# Patient Record
Sex: Male | Born: 1985 | Race: White | Hispanic: No | Marital: Married | Smoking: Never smoker
Health system: Southern US, Academic
[De-identification: ages and names within clinical notes are randomized; demographics above are authoritative.]

## PROBLEM LIST (undated history)

## (undated) DIAGNOSIS — I471 Supraventricular tachycardia, unspecified: Secondary | ICD-10-CM

## (undated) DIAGNOSIS — F411 Generalized anxiety disorder: Secondary | ICD-10-CM

## (undated) DIAGNOSIS — M1990 Unspecified osteoarthritis, unspecified site: Secondary | ICD-10-CM

## (undated) DIAGNOSIS — K900 Celiac disease: Secondary | ICD-10-CM

## (undated) DIAGNOSIS — G4720 Circadian rhythm sleep disorder, unspecified type: Secondary | ICD-10-CM

## (undated) DIAGNOSIS — I10 Essential (primary) hypertension: Secondary | ICD-10-CM

## (undated) DIAGNOSIS — G40909 Epilepsy, unspecified, not intractable, without status epilepticus: Secondary | ICD-10-CM

## (undated) HISTORY — DX: Generalized anxiety disorder: F41.1

## (undated) HISTORY — PX: HX HERNIA REPAIR: SHX51

## (undated) HISTORY — PX: HX HIP REPLACEMENT: SHX124

## (undated) HISTORY — PX: COLONOSCOPY: SHX174

## (undated) HISTORY — PX: HX VASECTOMY: SHX75

## (undated) HISTORY — PX: ORTHOPEDIC SURGERY: SHX850

## (undated) HISTORY — PX: HX SHOULDER SURGERY: 2100001311

## (undated) HISTORY — PX: HX UPPER ENDOSCOPY: 2100001144

## (undated) HISTORY — PX: RENAL ARTERY STENT: SHX2321

## (undated) HISTORY — PX: HX GALL BLADDER SURGERY/CHOLE: SHX55

## (undated) HISTORY — PX: HX APPENDECTOMY: SHX54

## (undated) HISTORY — DX: Circadian rhythm sleep disorder, unspecified type: G47.20

## (undated) HISTORY — DX: Supraventricular tachycardia (CMS HCC): I47.1

## (undated) HISTORY — PX: BLADDER SURGERY: SHX569

## (undated) HISTORY — DX: Epilepsy, unspecified, not intractable, without status epilepticus: G40.909

## (undated) HISTORY — PX: SEPTOPLASTY: SUR1290

## (undated) HISTORY — PX: HX LITHOTRIPSY: SHX66

## (undated) HISTORY — DX: Unspecified osteoarthritis, unspecified site: M19.90

## (undated) HISTORY — PX: HIP SURGERY: SHX245

## (undated) HISTORY — DX: Supraventricular tachycardia, unspecified: I47.10

---
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
## 2016-08-02 IMAGING — CR XRAY SHOULDER COMPLETE LT
1 series · 2 of 2 positions shown · non-contrast
Comparison: None

Exam:   

Left shoulder 2V
INDICATION: Pain.

[Series 1: view not recorded · 0.17mm/px · 2 of 2 slices shown]
[im 1/2]
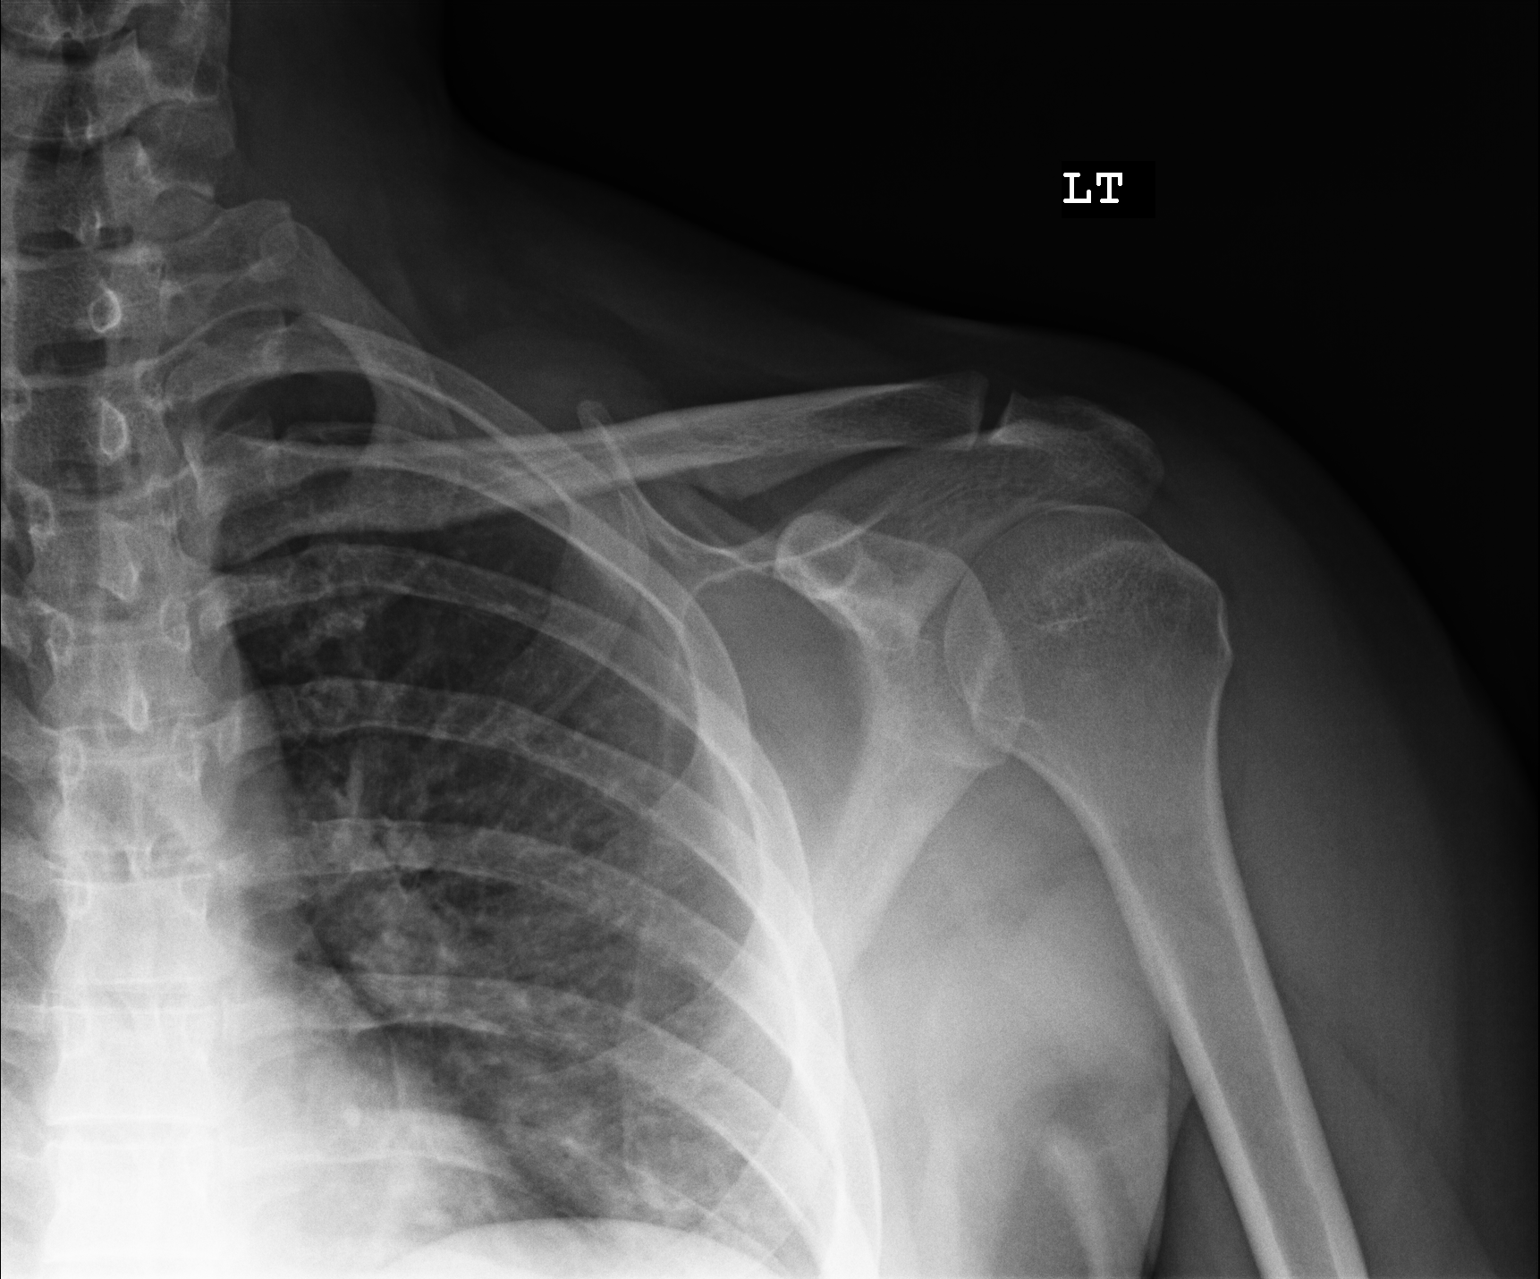
[im 2/2]
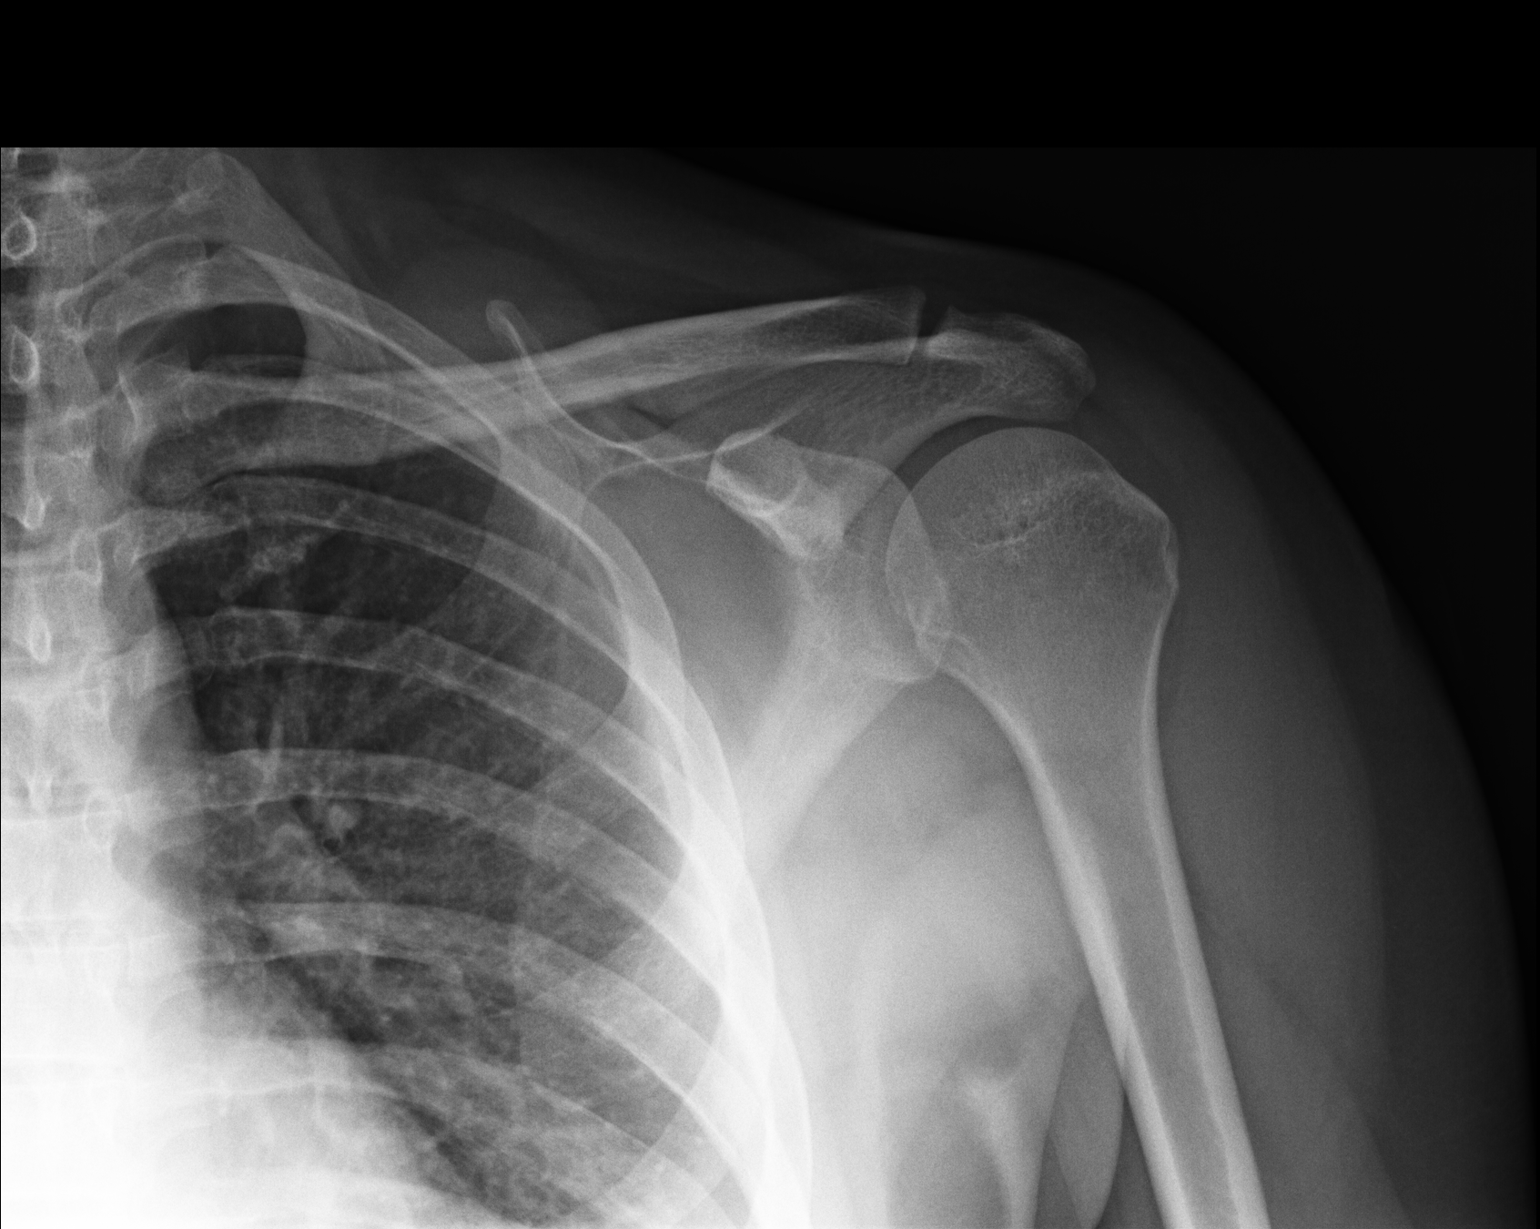

[2 of 2 positions shown; findings below may reference images not displayed]

FINDINGS: The humerus is seen in internal and external rotation. No fracture of the humerus or glenoid is seen. No evidence for dislocation is seen. The acromial humeral space is normal. The acromioclavicular joint is unremarkable. 

The visualized lung is clear.
IMPRESSION: Negative left shoulder.

## 2017-07-31 IMAGING — CR ABDOMEN XRAY 1 VIEW
1 series · 1 of 1 positions shown · non-contrast
Comparison: None.

Exam:  ABDOMEN XRAY 1 VIEW
INDICATION: Hematuria and kidney stones.

[view not recorded]
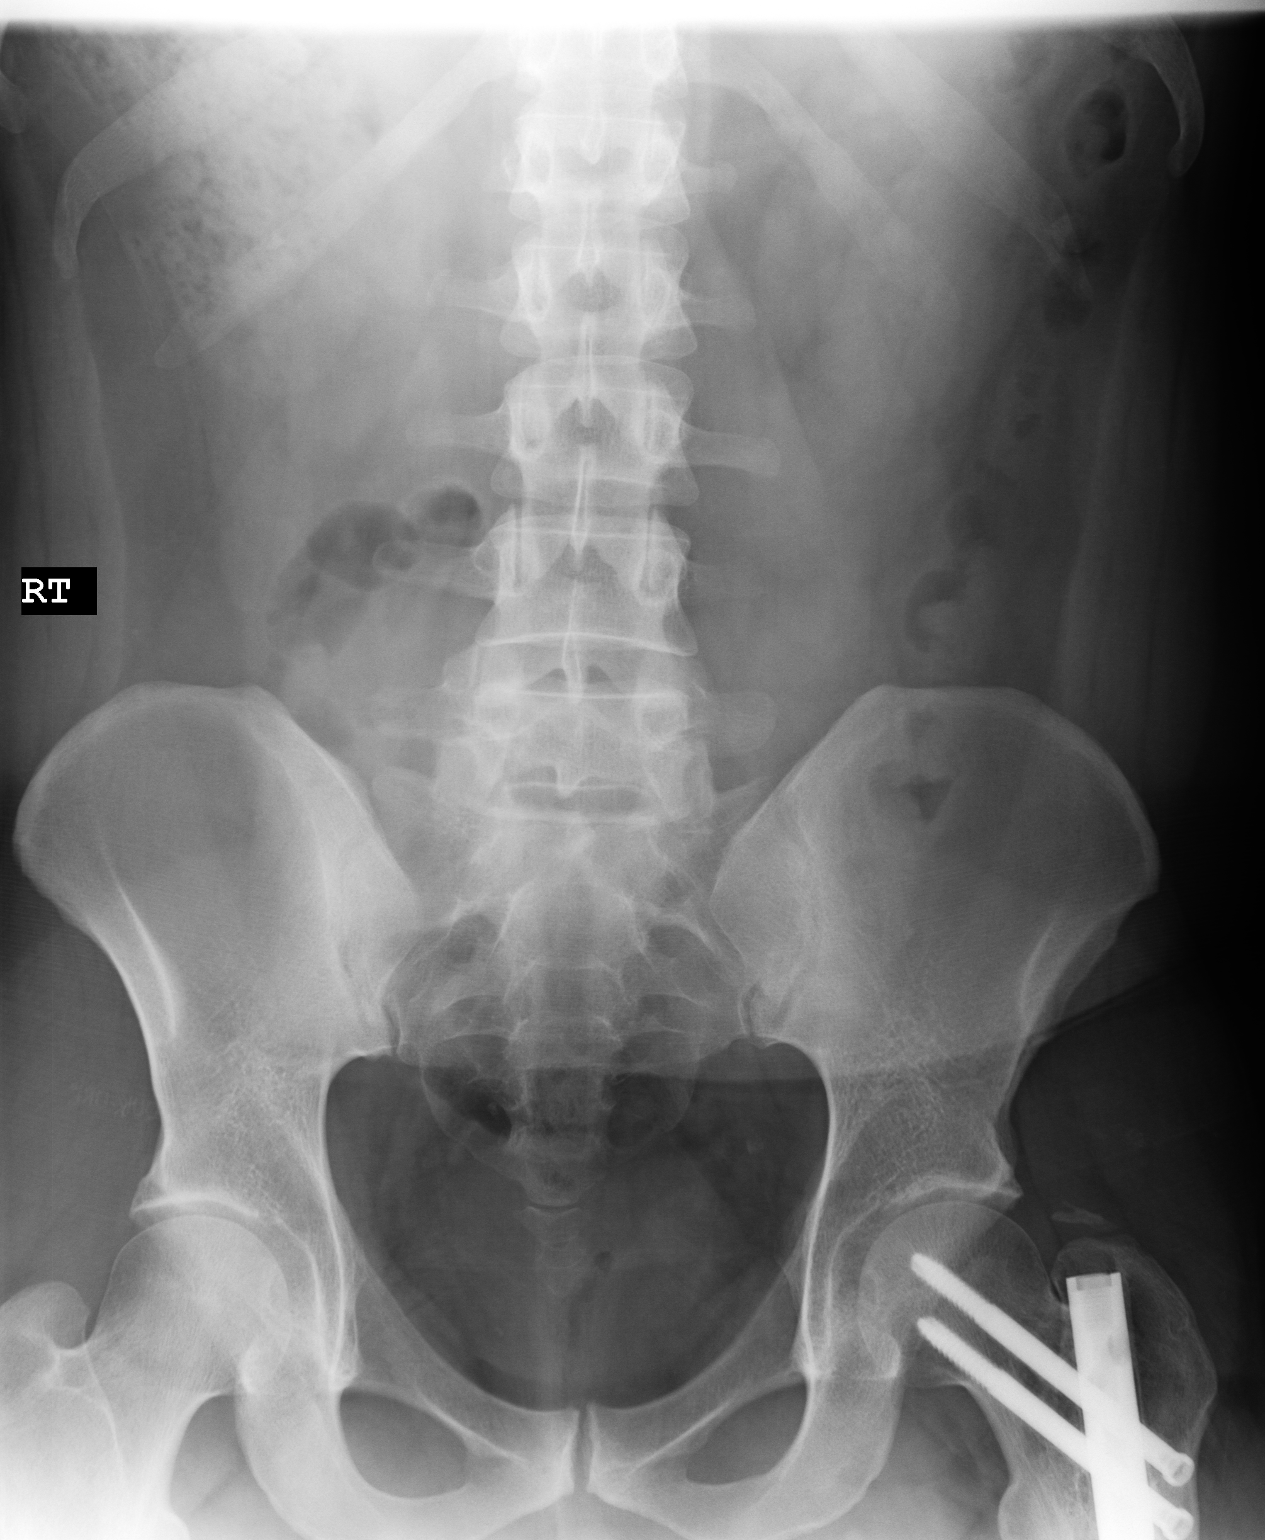

[1 of 1 positions shown; findings below may reference images not displayed]

FINDINGS: Bowel gas pattern is normal. No radiopaque renal calculi are seen. Post-surgical changes are noted within the left hip. Surrounding soft tissues are unremarkable.
IMPRESSION: Unremarkable exam. Please consider further evaluation with CT scan for persistent or worsening symptoms.

## 2018-10-02 IMAGING — CR XRAY LUMBAR SPINE COMPLETE
1 series · 5 of 5 positions shown · non-contrast
Comparison: None available.

EXAM:  XRAY LUMBAR SPINE COMPLETE
INDICATION: Low back pain.

[Series 1: view not recorded · 0.17mm/px · 5 of 5 slices shown]
[im 1/5]
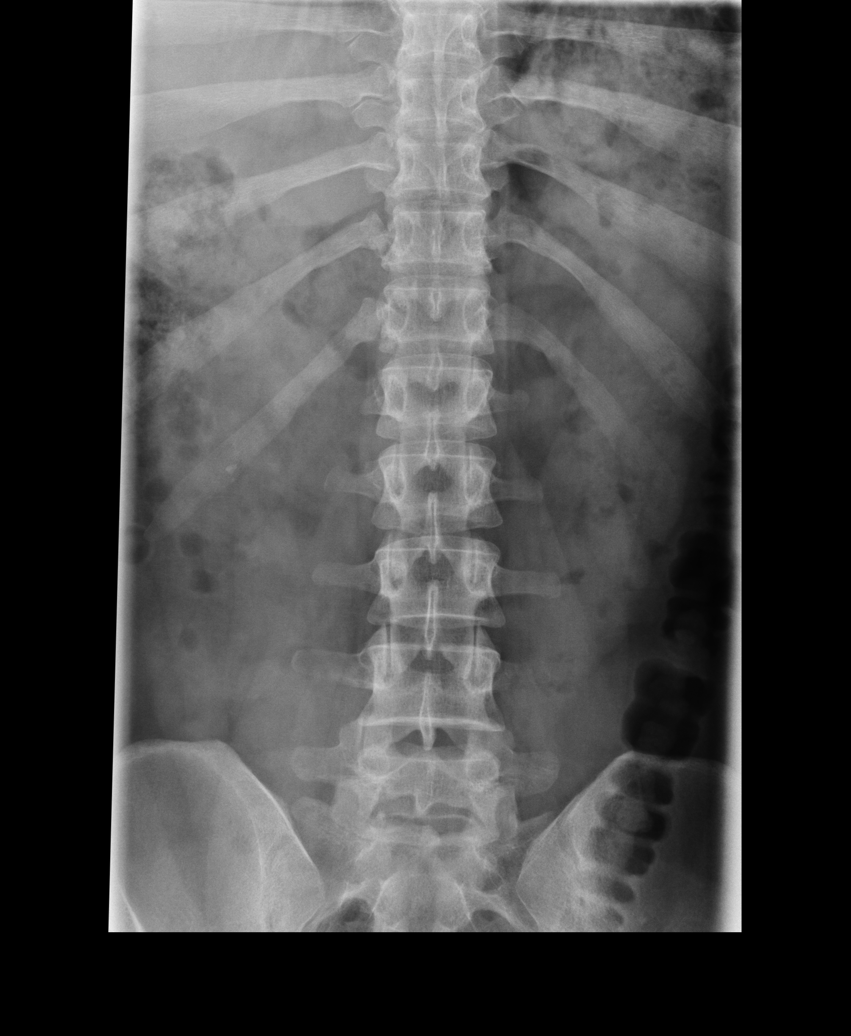
[im 2/5]
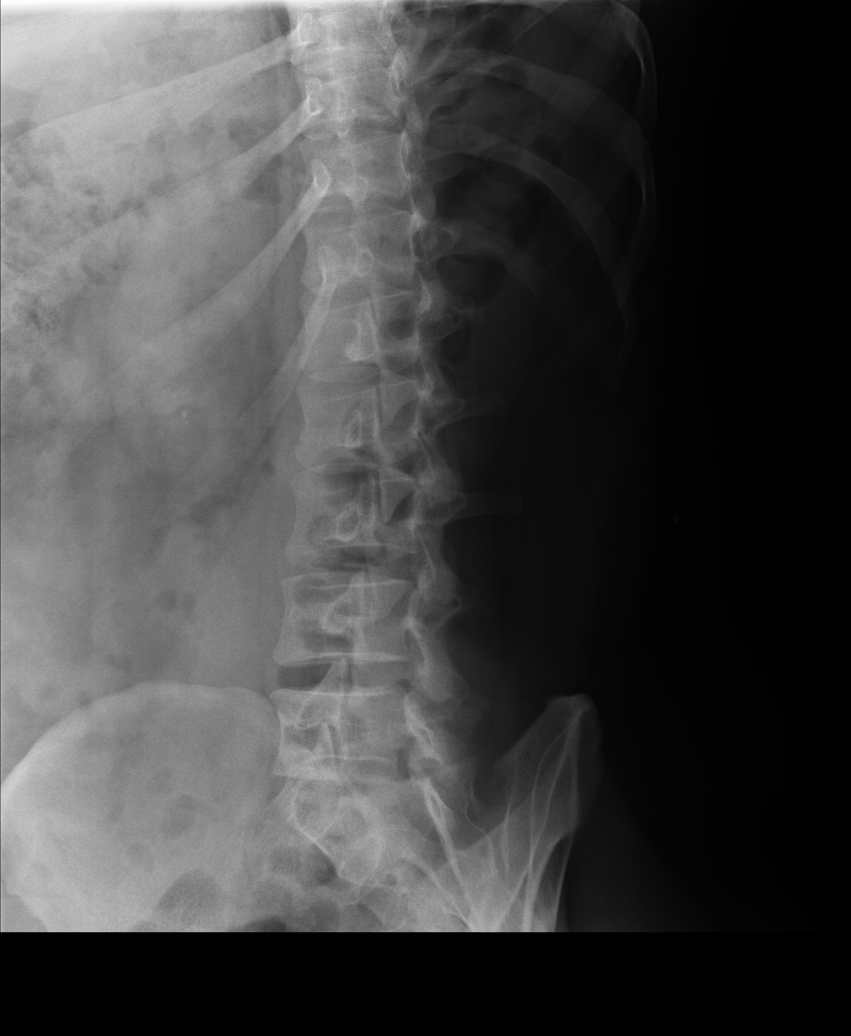
[im 3/5]
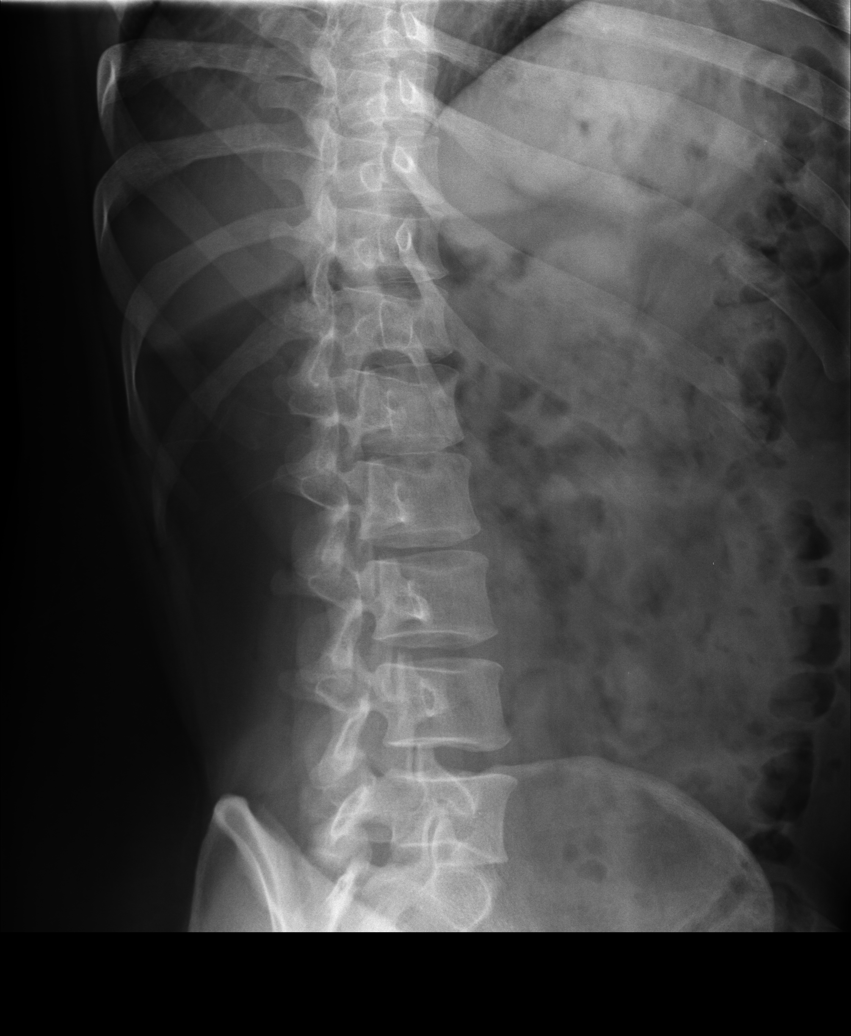
[im 4/5]
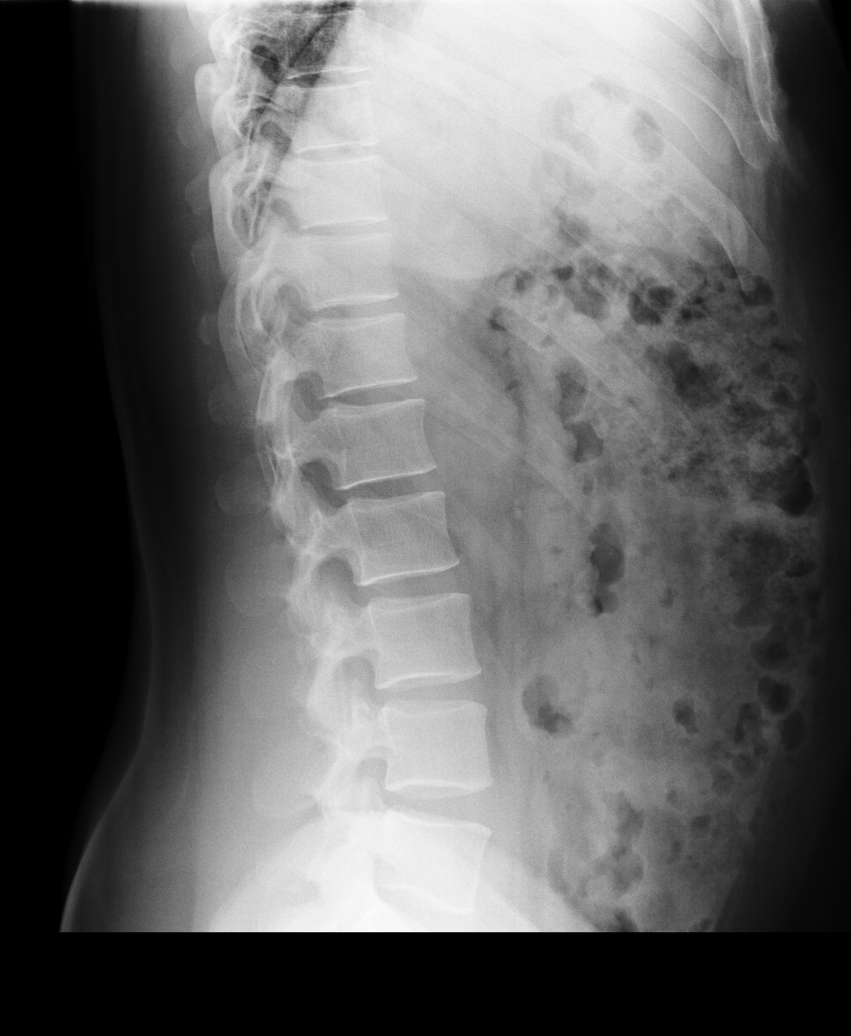
[im 5/5]
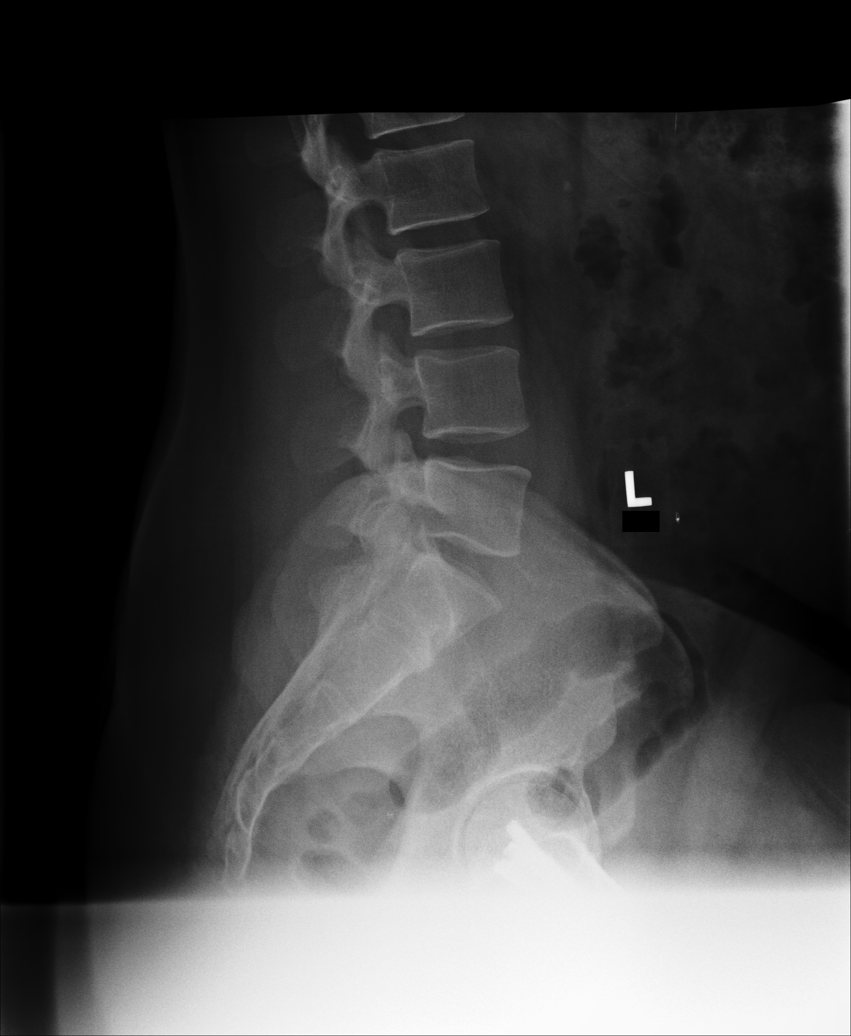

[5 of 5 positions shown; findings below may reference images not displayed]

FINDINGS: Vertebral bodies are normal in height and alignment. There is no acute fracture or subluxation. No significant degenerative disc disease or facet arthropathy is seen at any level. There is no definite pars defect on the oblique views. Paraspinal soft tissues are unremarkable.
IMPRESSION: Unremarkable exam.

## 2018-10-02 IMAGING — CR ABDOMEN XRAY 2 VIEWS
1 series · 2 of 2 positions shown · non-contrast
Comparison: 07/31/2017.

EXAM:  ABDOMEN XRAY 2 VIEWS
INDICATION: Abdominal and back pain.

[Series 1: view not recorded · 0.17mm/px · 2 of 2 slices shown]
[im 1/2]
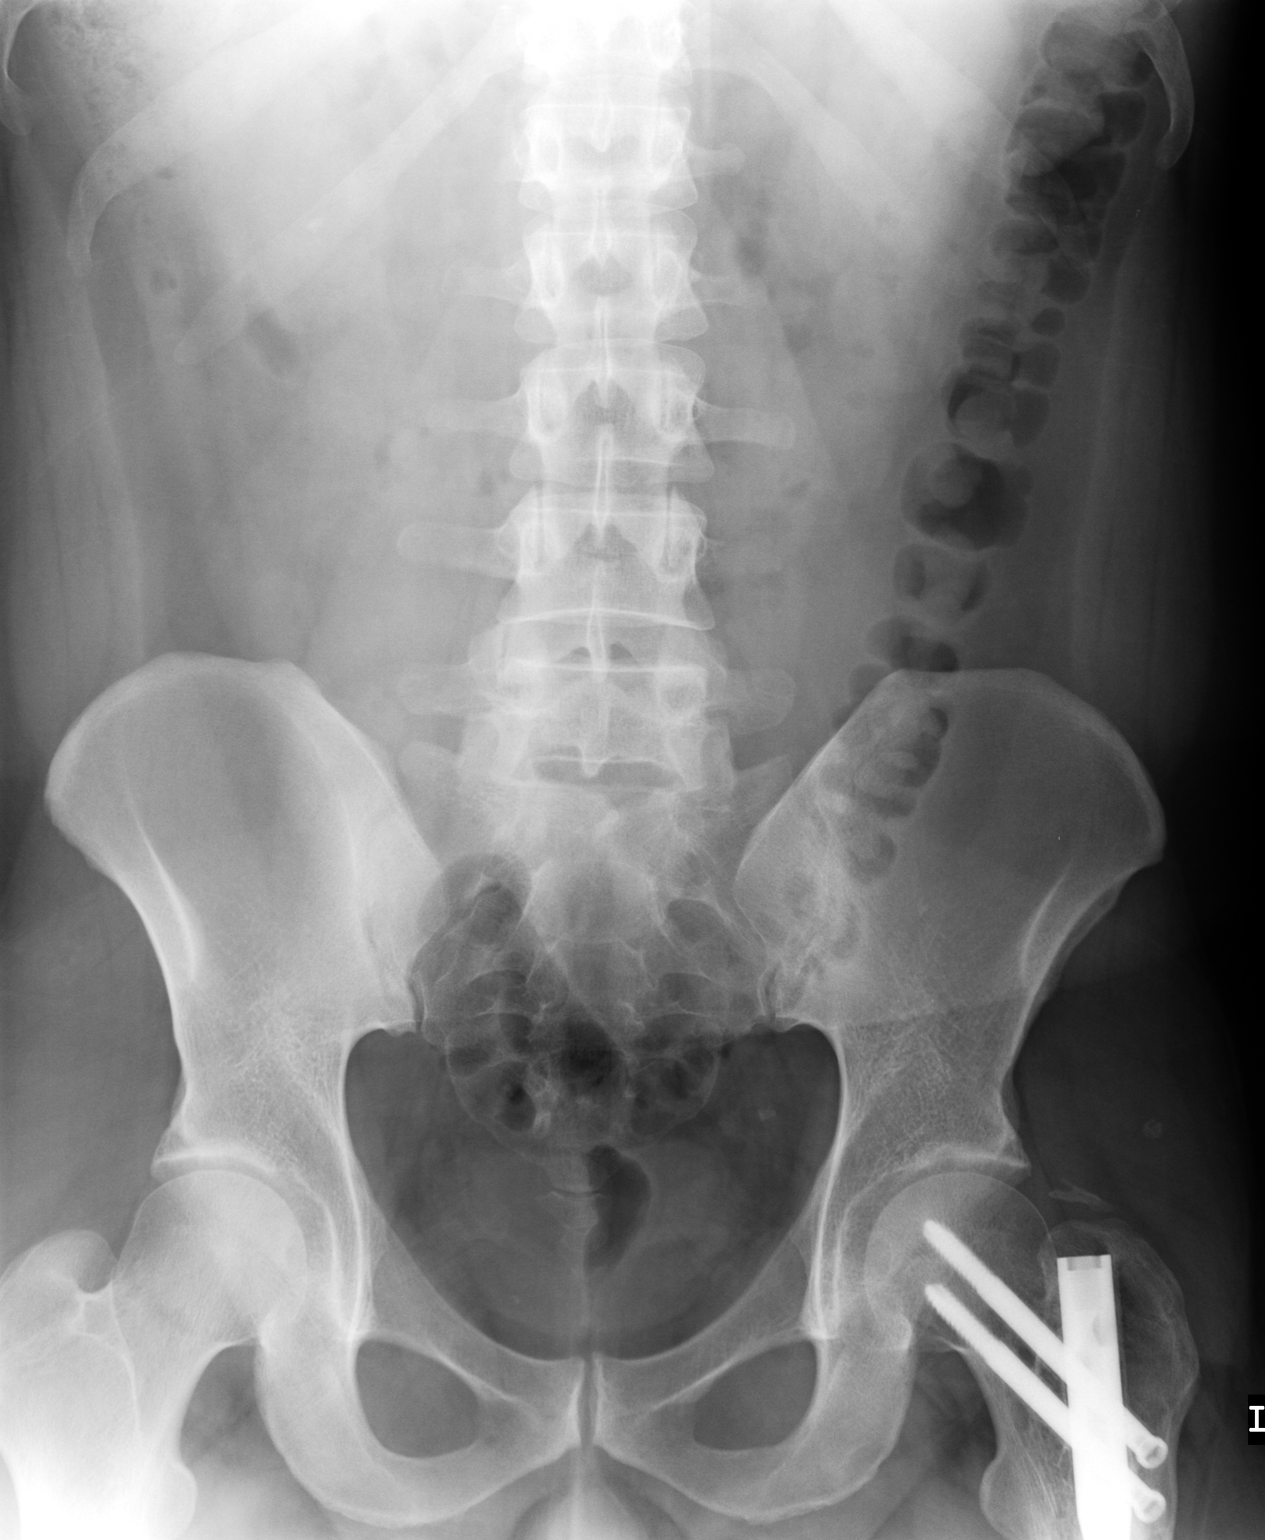
[im 2/2]
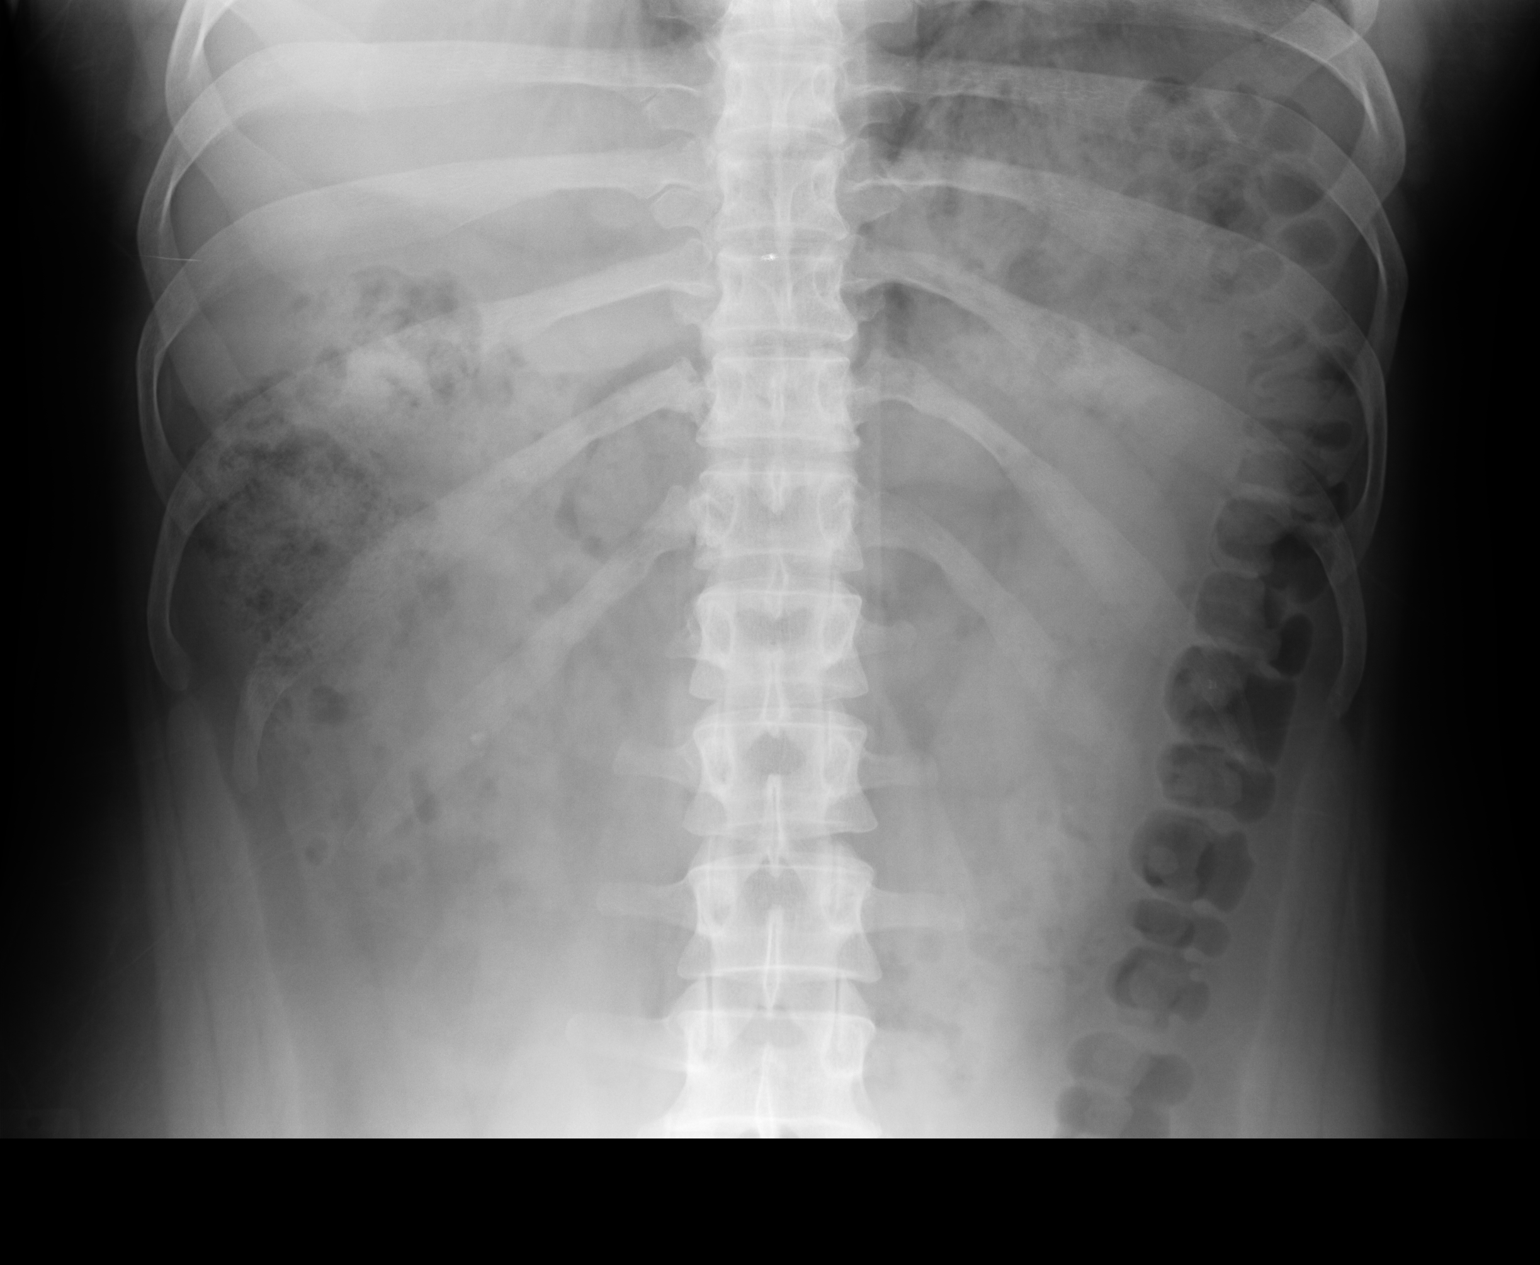

[2 of 2 positions shown; findings below may reference images not displayed]

FINDINGS: There is a questionable 4 mm right renal calculus. Moderate amount of stool is seen throughout the colon. Postsurgical changes of the left hip are again noted.
IMPRESSION: Questionable 4 mm right renal calculus.

## 2020-04-08 IMAGING — MR MRI JOINT UPPER EXTREMITY WITHOUT CONTRAST LT
5 of 6 series · 33 of 40 positions shown · non-contrast
Comparison: None.

EXAM:  MRI JOINT UPPER EXTREMITY WITHOUT CONTRAST LT SHOULDER
INDICATION: Left shoulder pain.
TECHNIQUE: An MRI of the patient’s left shoulder is performed with multiple images in the axial, oblique coronal and oblique sagittal planes.  T1 and T2-weighted scanning procotols are utilized.

[Series 9: T1 · oblique · left · 4.0mm · 0.31mm/px · 8 of 22 slices shown]
[im 1/22]
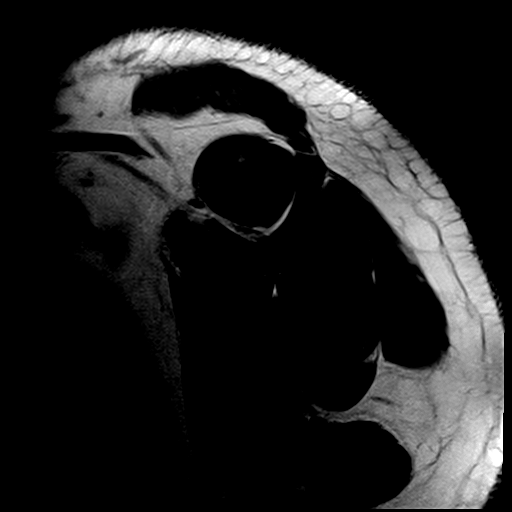
[im 4/22]
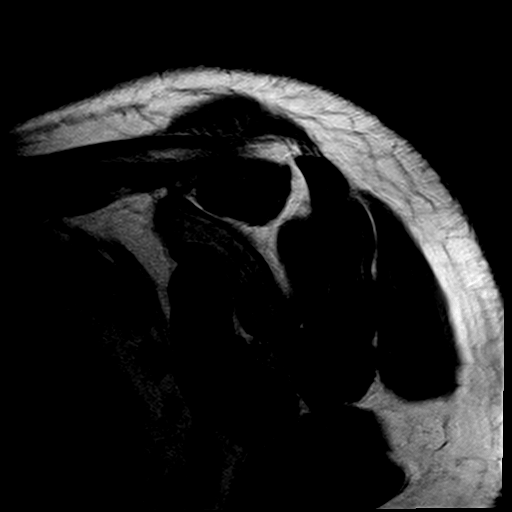
[im 7/22]
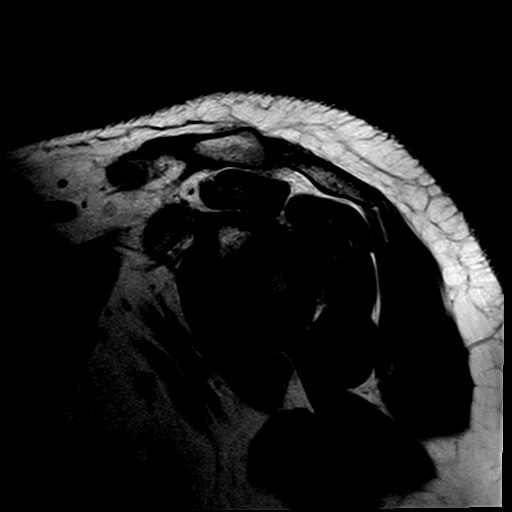
[im 10/22]
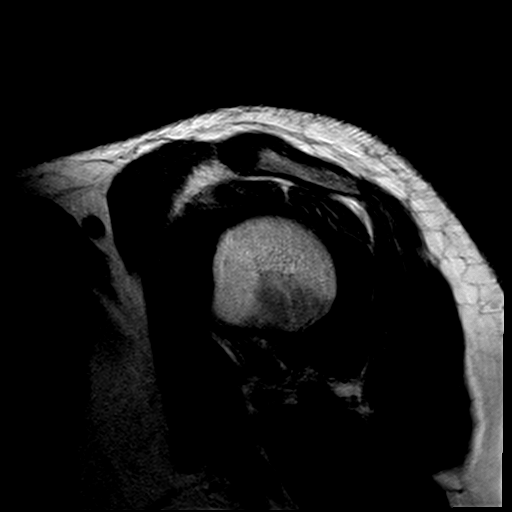
[im 13/22]
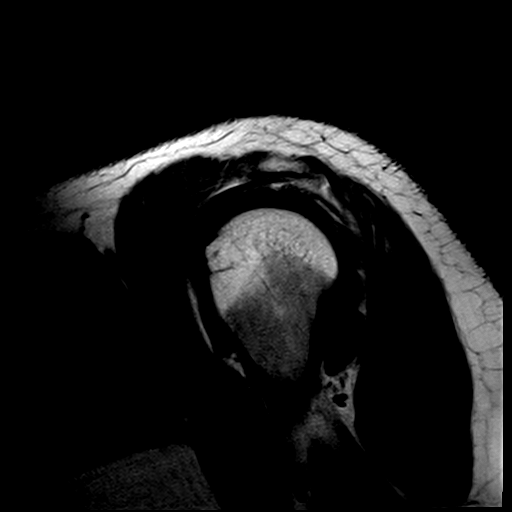
[im 16/22]
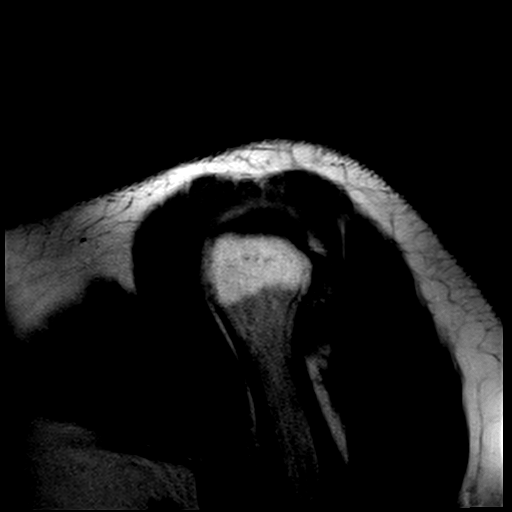
[im 19/22]
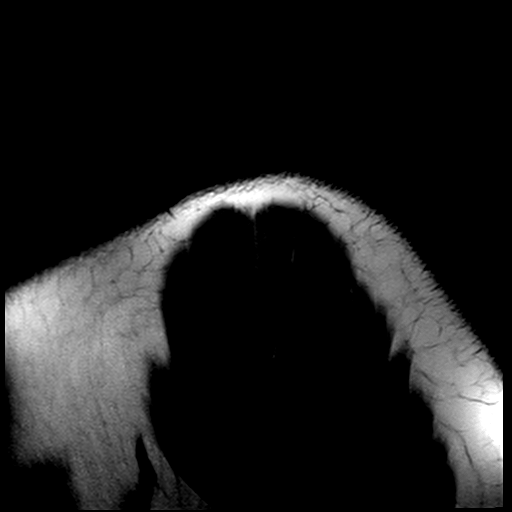
[im 22/22]
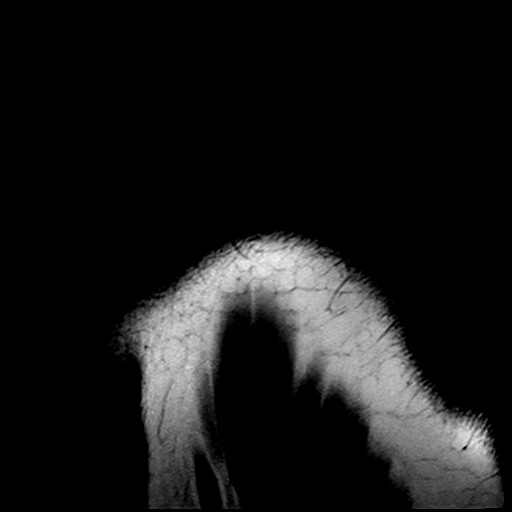

[Series 10: PD fat-sat · axial · left · 4.0mm · 0.50mm/px · z∈[-53,+23]mm · 6 of 18 slices shown (1 of 2)]
[im 1/18]
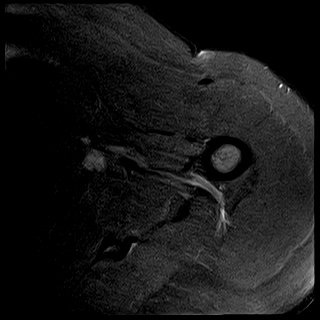
[im 4/18]
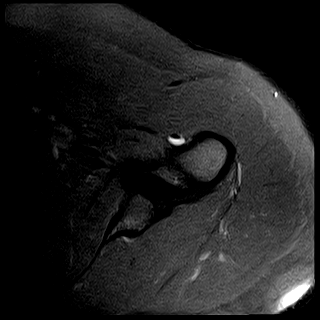
[im 7/18]
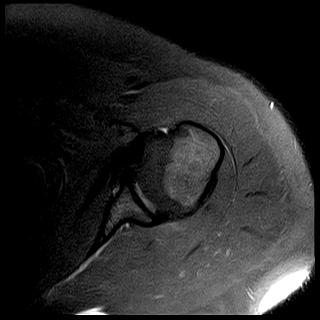
[im 11/18]
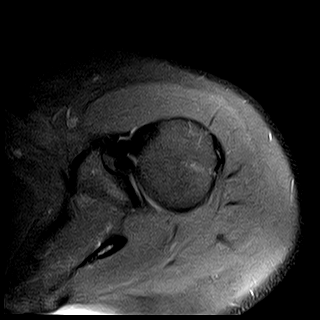
[im 14/18]
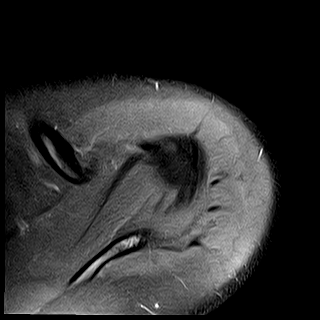
[im 18/18]
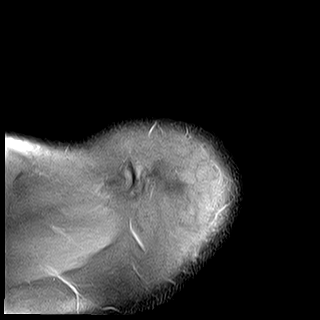

[Series 11: STIR · oblique · left · 3.5mm · 0.47mm/px · 5 of 18 slices shown]
[im 1/18]
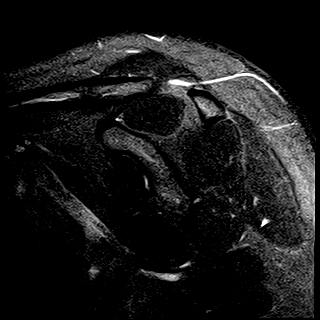
[im 4/18]
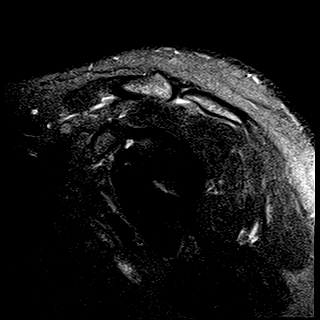
[im 7/18]
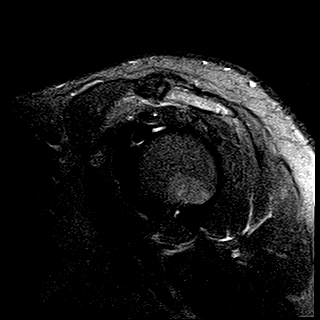
[im 11/18]
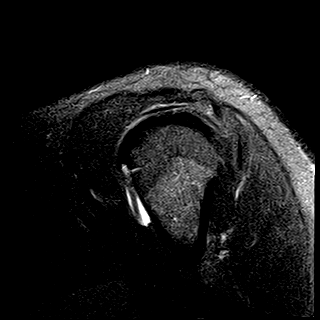
[im 14/18]
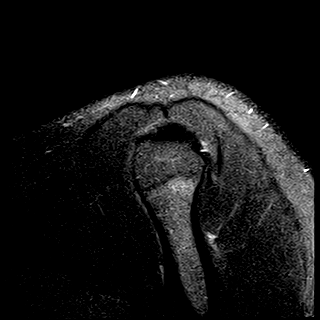

[Series 12: T2 fat-sat · axial · left · 4.0mm · 0.42mm/px · z∈[-67,+37]mm · 8 of 24 slices shown]
[im 1/24]
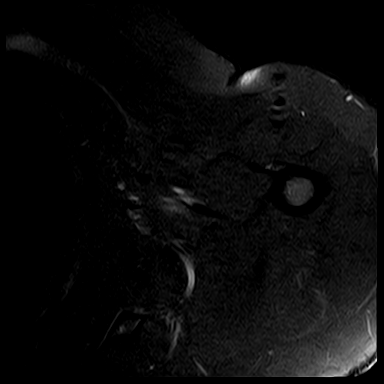
[im 4/24]
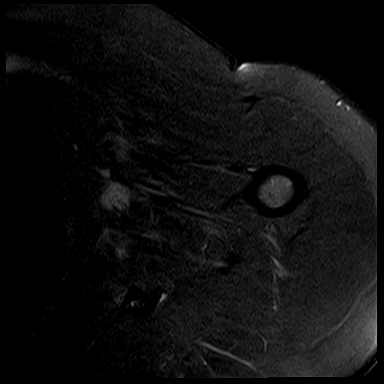
[im 7/24]
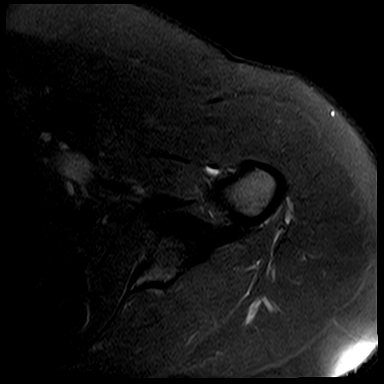
[im 10/24]
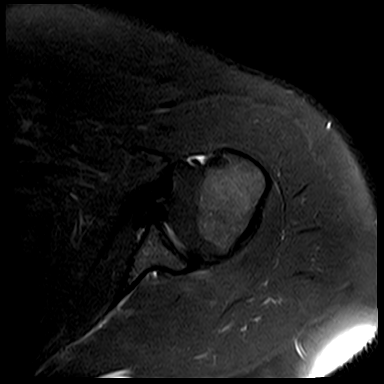
[im 14/24]
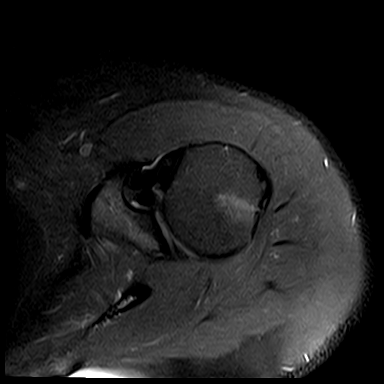
[im 17/24]
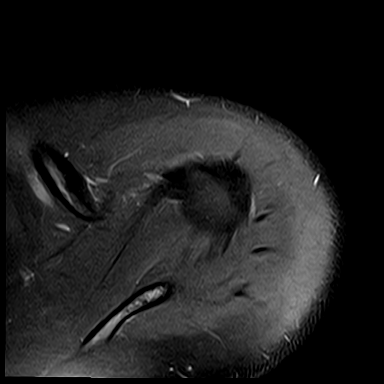
[im 20/24]
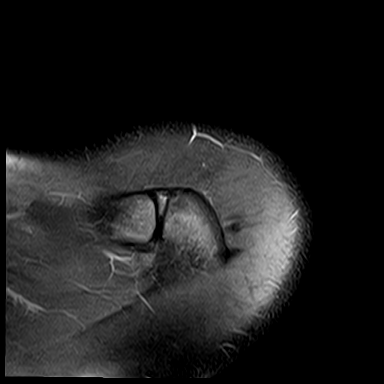
[im 24/24]
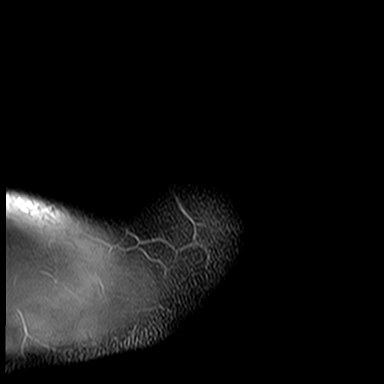

[Series 13: PD fat-sat · oblique · left · 3.5mm · 0.47mm/px · 6 of 18 slices shown (2 of 2)]
[im 1/18]
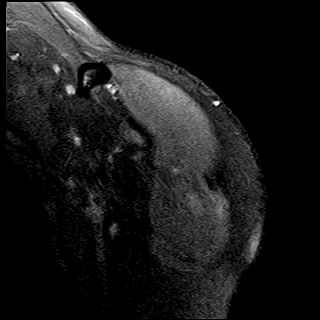
[im 4/18]
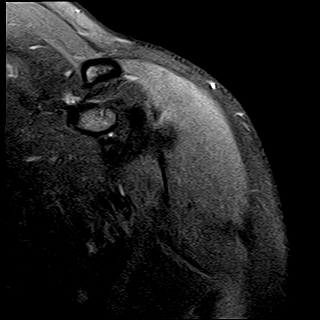
[im 7/18]
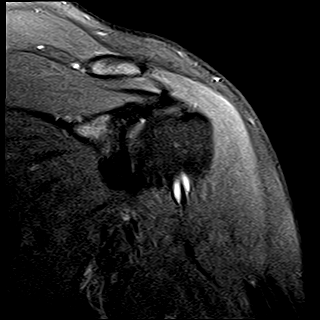
[im 11/18]
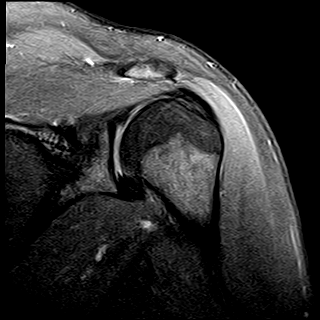
[im 14/18]
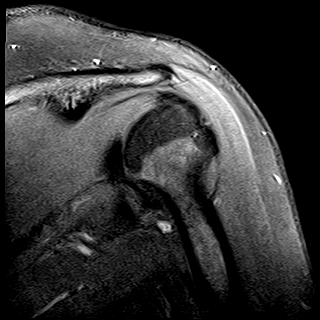
[im 18/18]
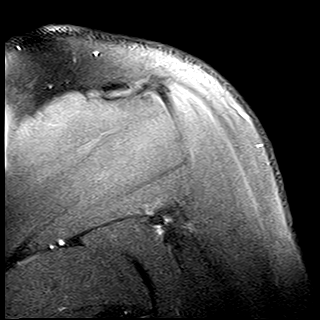

[33 of 40 positions shown; findings below may reference images not displayed]

FINDINGS: The examination shows on the axial images a torn anterior labrum of the glenoid cartilage.  The posterior labrum is intact.  Oblique coronal and oblique sagittal images show the superior and inferior labra of the glenoid cartilage to be intact.  No evidence for a tear is seen in the rotator cuff.  No effusions are noted in the subdeltoid or subacromial bursa.  The bone marrow signal intensity in the proximal femur and glenoid is within normal limits.
IMPRESSION: Tear anterior labrum of the glenoid cartilage.

The remaining labra appear intact. 

No evidence for a tear or inflammatory change to the supraspinatus tendon or rotator cuff.

## 2022-01-13 ENCOUNTER — Encounter (RURAL_HEALTH_CENTER): Payer: Self-pay | Admitting: Internal Medicine

## 2022-01-13 NOTE — Progress Notes (Signed)
[facility]ICINEan affiliate of [facility]  Patient:  Andrew Collier, [place] #: [id number]  Date of Service: 08/10/21 MR #: [id number]  Provider: Jeral Pinch P.A.C. PCP: Jeral Pinch P.A.C.  DOB: October 31, 1986 Age/Sex: 35/M Referring Provider:     Internal Medicine VisitSignedHPI  Problem-AMB  Problem  acute  irritated skin tags   neck and axilla    co fast heart rate and  doe   all new in last few weeks  Pt says he doent feel good  during that time    no new changes except  pt is post covid     will get eval with cardiology   as despite pt being over wt  he has not had issues like this     co ar  current meds not helping  discussed  meds       Intake  Vital Signs      08/10/21  15:05  Pulse   105 H  Pulse Source   Monitor  Temp   97.8 F  Temp Source   Tympanic  Pulse Oximetry (%)   98  Oxygen Delivery Method   room air    Chief Complaint:  skin tag removal and mole check and pt states heart has been racing and bp elevated last several weeks    Add today's problem/HPI: CDM Chronic Disease Management  Allergies    Allergy/AdvReac Type Severity Reaction Status Date / Time  morphine Allergy Intermediate heartrate Verified 09/15/20 13:03        drops      carbamazepine Allergy Unknown UNKNOWN Verified 09/15/20 13:03  diphenhydramine Allergy Unknown UNKNOWN Verified 09/15/20 13:03  latex Allergy Unknown UNKNOWN Verified 09/15/20 13:03  cephalexin AdvReac Severe AFFECTS Verified 09/15/20 13:03        HIS            SEIZURE            MEDS.      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1  Hydronephrosis, left (Acute) N13.30  Ureteral stricture, left (Suspected) N13.5      Past Medical History (Updated 08/20/21 @ 02:11 by Jeral Pinch, P.A.C.)    Circadian rhythm sleep disorder, 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  Epilepsy, unspecified, not intractable, without status epilepticus G40.909  Glenoid labrum tear S43.439A  Localization-related (focal) (partial) idiopathic epilepsy and epileptic syndromes with seizures of localized onset, intractable, without status epilepticus G40.019  Other abnormal involuntary movements R25.8  Other circadian rhythm  sleep disorder G47.29  Seizure disorder G40.909  Unspecified abnormal involuntary movements R25.9  Unspecified osteoarthritis, unspecified site M19.90    Surgical History (Reviewed 09/15/20 @ 13:04 by Mardene Celeste, MD)    History of cholecystectomy Z90.49  History of colonoscopy Z98.890  01/21/20  History of ear, nose, and throat (ENT) surgery Z98.890  05/28/15  septoplasty  History of endoscopy Z98.890  01/21/20  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  History of orthopedic surgery Z98.890  left knee cartlige removal  hip benign left hip tumor  left hip rod and pins in  femur  History of renal stent  x2   9/18  pervaiz  Hx of vasectomy Z98.52  S/P urological surgery Z98.890  kidney bladder surgery      Family History (Reviewed 09/15/20 @ 13:04 by  Mardene Celeste, MD)  FATHER  Hyperlipidemia  MOTHER  HTN (hypertension)  Hyperlipidemia  Rheumatoid arthritis    Social History (Reviewed 09/15/20 @ 13:04 by Mardene Celeste, MD)  Tobacco Status:  none  substance use/type:  does not use  alcohol use/freq:  never        Exam-AMB  EXAM  EXAM:  multple skin tags removed  under axilla and  neck   Const  General: cooperative and healthy appearing  Nutritional Appearance: obese  Resp  Effort & Inspection: normal respiratory effort  Auscultation: clear to auscultation bilaterally, no rhonchi and no wheezes  Cardio  Rhythm: regular rhythm  Other:  tachy 105       Office Procedures  Skin Tag Removal  Details:  multiple skin tags    Procedure performed by: Matzel,Kimberly  Informed consent given: Yes  Consent signed: No  Location: axilla and neck  Patient tolerated procedure: well  Complications: No  Additional details:  liquid nitrogen used   45 sec freeze to skin tag areas        A/P  Assessment & Plan  (1) Inflamed skin tag:        Status: Acute        Code(s):  L91.8 - Other hypertrophic disorders of the skin  (2) Exertional dyspnea:        Status: Acute        Code(s):  R06.09 - Other forms of dyspnea  (3) Tachycardia:        Status: Acute        Code(s):  R00.0 - Tachycardia, unspecified  (4) Post covid-19 condition, unspecified:        Status: Acute        Code(s):  U09.9 - Post COVID-19 condition, unspecified  (5) Allergic rhinitis:        Status: Acute        Code(s):  J30.9 - Allergic rhinitis, unspecified        Qualifiers:          Allergic rhinitis trigger: pollen  Allergic rhinitis seasonality: seasonal  Qualified Code(s): J30.1 - Allergic rhinitis due to pollen  (6) Elevated blood pressure reading:        Status: Acute        Code(s):  R03.0 - Elevated blood-pressure reading, without diagnosis of hypertension    Plan  referral cardiology for eval  doe  and  tachycardia   pt  is on low caffiene  no new meds  add xyxal and qnasal for allergies  pt tolerated  liquid nitrogen  tx          Orders:  Referrals  REFERRAL TO CARDIOLOGY  R00.0 - Tachycardia, unspecified, R06.09 - Other forms of dyspnea, U09.9 - Post COVID-19 condition, unspecified, R03.0 - Elevated blood-pressure reading, without diagnosis of hypertension            Medications:  New  levocetirizine (Xyzal) 5 mg PO QDAY 30 tabs 3RF      beclomethasone dipropionate 80 mcg/actuation (QNASL) 2 sprays intranasal QDAY 8.7 mL 2RF        Continuity of Care Document  Goals & Barriers:  Goals  compliance with plan of care  Barriers  compliance with medication        Signed By:<Electronically signed by  Jeral Pinch P.A.C.>08/20/21 [id number]  Copies to:                  Matzel P.A.C., Cala Bradford

## 2022-01-25 ENCOUNTER — Encounter (RURAL_HEALTH_CENTER): Payer: Self-pay | Admitting: Internal Medicine

## 2022-01-25 DIAGNOSIS — I471 Supraventricular tachycardia: Secondary | ICD-10-CM

## 2022-01-25 DIAGNOSIS — G40909 Epilepsy, unspecified, not intractable, without status epilepticus: Secondary | ICD-10-CM

## 2022-01-25 DIAGNOSIS — M1990 Unspecified osteoarthritis, unspecified site: Secondary | ICD-10-CM

## 2022-01-25 DIAGNOSIS — F411 Generalized anxiety disorder: Secondary | ICD-10-CM

## 2022-01-25 DIAGNOSIS — G4720 Circadian rhythm sleep disorder, unspecified type: Secondary | ICD-10-CM

## 2022-01-25 NOTE — Progress Notes (Signed)
Open in error   Nothing documented

## 2022-01-26 ENCOUNTER — Inpatient Hospital Stay
Admission: RE | Admit: 2022-01-26 | Discharge: 2022-01-26 | Disposition: A | Discharge status: Home / Self Care | Payer: 59 | Source: Ambulatory Visit | Attending: Internal Medicine | Admitting: Internal Medicine

## 2022-01-26 ENCOUNTER — Other Ambulatory Visit: Payer: Self-pay

## 2022-01-26 ENCOUNTER — Ambulatory Visit (RURAL_HEALTH_CENTER): Payer: 59 | Attending: Internal Medicine | Admitting: Internal Medicine

## 2022-01-26 ENCOUNTER — Inpatient Hospital Stay (HOSPITAL_BASED_OUTPATIENT_CLINIC_OR_DEPARTMENT_OTHER)
Admission: RE | Admit: 2022-01-26 | Discharge: 2022-01-26 | Disposition: A | Discharge status: Home / Self Care | Payer: 59 | Source: Ambulatory Visit

## 2022-01-26 ENCOUNTER — Encounter (RURAL_HEALTH_CENTER): Payer: Self-pay | Admitting: Internal Medicine

## 2022-01-26 VITALS — BP 130/80 | HR 84 | Temp 98.2°F | Ht 65.0 in | Wt 204.0 lb

## 2022-01-26 DIAGNOSIS — M549 Dorsalgia, unspecified: Secondary | ICD-10-CM

## 2022-01-26 DIAGNOSIS — F411 Generalized anxiety disorder: Secondary | ICD-10-CM | POA: Insufficient documentation

## 2022-01-26 DIAGNOSIS — I471 Supraventricular tachycardia, unspecified: Secondary | ICD-10-CM | POA: Insufficient documentation

## 2022-01-26 DIAGNOSIS — M1990 Unspecified osteoarthritis, unspecified site: Secondary | ICD-10-CM | POA: Insufficient documentation

## 2022-01-26 DIAGNOSIS — M546 Pain in thoracic spine: Secondary | ICD-10-CM

## 2022-01-26 DIAGNOSIS — G40909 Epilepsy, unspecified, not intractable, without status epilepticus: Secondary | ICD-10-CM | POA: Insufficient documentation

## 2022-01-26 DIAGNOSIS — K580 Irritable bowel syndrome with diarrhea: Secondary | ICD-10-CM | POA: Insufficient documentation

## 2022-01-26 DIAGNOSIS — R197 Diarrhea, unspecified: Secondary | ICD-10-CM | POA: Insufficient documentation

## 2022-01-26 DIAGNOSIS — G4720 Circadian rhythm sleep disorder, unspecified type: Secondary | ICD-10-CM | POA: Insufficient documentation

## 2022-01-26 MED ORDER — METHYLPREDNISOLONE ACETATE 80 MG/ML SUSPENSION FOR INJECTION
80.0000 mg | INTRAMUSCULAR | Status: AC
Start: 2022-01-26 — End: 2022-01-26
  Administered 2022-01-26: 80 mg via INTRAMUSCULAR

## 2022-01-26 MED ORDER — IBUPROFEN 800 MG TABLET
800.0000 mg | ORAL_TABLET | Freq: Three times a day (TID) | ORAL | 0 refills | Status: DC | PRN
Start: 2022-01-26 — End: 2022-02-22

## 2022-01-26 MED ORDER — RIFAXIMIN 550 MG TABLET
550.0000 mg | ORAL_TABLET | Freq: Two times a day (BID) | ORAL | 0 refills | Status: DC
Start: 2022-01-26 — End: 2022-03-01

## 2022-01-26 MED ORDER — CYCLOBENZAPRINE 10 MG TABLET
10.0000 mg | ORAL_TABLET | Freq: Three times a day (TID) | ORAL | 0 refills | Status: AC
Start: 2022-01-26 — End: 2022-02-05

## 2022-01-26 MED ORDER — PREDNISONE 20 MG TABLET
20.0000 mg | ORAL_TABLET | Freq: Two times a day (BID) | ORAL | 0 refills | Status: AC
Start: 2022-01-26 — End: 2022-01-31

## 2022-01-26 NOTE — Assessment & Plan Note (Signed)
Stretches    Heat /ice to back       Start ibuprfofen after prednisone      flexoril   Prn      Depo   80 im x  1     Xray ordered

## 2022-01-26 NOTE — Progress Notes (Signed)
Name: Andrew Collier                       Date of Birth: 08-17-1986   MRN:  [id number]                         Date of visit: 01/26/2022     PCP: Samuella Cota, PA-C     Subjective  Andrew Collier is a 36 y.o. year old male who presents for Back Injury (Patient fell Monday feeding animals and fell in mud)   to clinic.  No specialty comments available.   Patient Active Problem List    Diagnosis Date Noted   . 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 Epilepsy (CMS HCC) 01/26/2022   . GAD (generalized anxiety disorder) 01/26/2022   . Osteoarthritis 01/26/2022   . Paroxysmal supraventricular tachycardia (CMS HCC) 01/26/2022   . Thoracic back pain 01/26/2022   . Diarrhea 01/26/2022      Current Outpatient Medications   Medication Sig   . cholestyramine-aspartame (PREVALITE) 4 gram Oral Powder in Packet Take 1 Packet (4 g total) by mouth Every evening with dinner (Patient not taking: Reported on 01/26/2022)   . cyclobenzaprine (FLEXERIL) 10 mg Oral Tablet Take 1 Tablet (10 mg total) by mouth Three times a day for 10 days   . dicyclomine (BENTYL) 10 mg Oral Capsule Take 1 Capsule (10 mg total) by mouth Four times a day   . fluticasone propionate (FLONASE) 50 mcg/actuation Nasal Spray, Suspension Administer 2 Sprays into each nostril Once a day   . Ibuprofen (MOTRIN) 800 mg Oral Tablet Take 1 Tablet (800 mg total) by mouth Three times a day as needed for Pain for up to 30 days   . Levocetirizine (XYZAL) 5 mg Oral Tablet Take 1 Tablet (5 mg total) by mouth Every evening   . metoprolol succinate (TOPROL-XL) 25 mg Oral Tablet Sustained Release 24 hr Take 1 Tablet (25 mg total) by mouth Once a day   . montelukast (SINGULAIR) 10 mg Oral Tablet Take 1 Tablet (10 mg total) by mouth Every evening   . omeprazole (PRILOSEC) 20 mg Oral Capsule, Delayed Release(E.C.) Take 1 Capsule (20 mg total) by mouth Twice daily   . predniSONE (DELTASONE) 20 mg Oral Tablet Take 1 Tablet (20 mg total) by mouth Twice daily for 5 days   .  rifAXIMin (XIFAXAN) 550 mg Oral Tablet Take 1 Tablet (550 mg total) by mouth Twice daily for 14 days   . sucralfate (CARAFATE) 1 gram Oral Tablet Take 1 Tablet (1 g total) by mouth Twice a day before meals      REVIEW OF SYSTEMS:   General: No fever.  No chills.  No weight changes.  HEENT: No vision changes.  Cardiac: No chest pain. No palpitations.  No dizziness.  No light-headedness.  No near syncope.  Resp: No dyspnea at rest, no dyspnea on exertion; no cough or hemoptysis; no orthopnea or PND.  GI: No N/V. No melena. Diarrhea  With eating    Ext: No edema.  No claudication.  Neuro: No focal weakness.  No numbness.  All other ROS negative.      Objective: BP 130/80 (Site: Left, Patient Position: Sitting)   Pulse 84   Temp 36.8 C (98.2 F)   Ht 1.651 m (5\' 5" )   Wt 92.5 kg (204 lb)   SpO2 98%   BMI 33.95 kg/m  This 36  Yo  Is here  For acute   Visit   Fall on  Monday  Larey Seat on back  And now hurting  On   Right  Side   Spine down       Sharp  Knife like  Pain         Tried  Ibuprofen  800   Tid  And no relief  Laying flat helps    Standing  Worse   Twisting to left  Worse         No numbness tingling burning         Fu  Recurrent diarrhea   Pt to see  Gastro  In [facility]    Pt has had this since  Surgery      Pt  Has been  On ab and better than comes back   Never tried on isfahan    No  Blood in stools         PHYSICAL EXAM  Gen: NAD. Alert.   Abdomen: Soft, non-tender.non-distended  nl bowel sounds    Extremities: No edema. No cyanosis. No clubbing.  Neurologic:  Grossly intact  Tender mid thoracic and   Lower  Thoracic   3-4  vertebrae minimal  Soreness flank  Areas  Nl straight leg raises     Nl alignment   Standing caues  Pain     Assessment/Plan  1. Back pain, unspecified back location, unspecified back pain laterality, unspecified chronicity    2. Acute midline thoracic back pain    3. Diarrhea, unspecified type      Depo-Medrol 80 mg injection was given x1  Patient do light stretches start  prednisone 20 mg b.i.d. x5 days then he can resume ibuprofen as needed Flexeril p.r.n. patient has been sent for x-rays thoracic and lumbar to rule out compression fracture    Regarding patient's recurrent diarrhea I am sending and Xifaxan for 2 weeks to see if there is any change he does have appointment coming up with [facility] gastroenterology

## 2022-01-26 NOTE — Nursing Note (Signed)
Patient fell on Monday and hurt back     Hurts for lower back to neck

## 2022-02-07 ENCOUNTER — Ambulatory Visit (RURAL_HEALTH_CENTER): Payer: Self-pay | Admitting: Internal Medicine

## 2022-02-10 ENCOUNTER — Other Ambulatory Visit (RURAL_HEALTH_CENTER): Payer: Self-pay | Admitting: Internal Medicine

## 2022-02-10 DIAGNOSIS — R1031 Right lower quadrant pain: Secondary | ICD-10-CM

## 2022-02-10 DIAGNOSIS — R197 Diarrhea, unspecified: Secondary | ICD-10-CM

## 2022-02-14 ENCOUNTER — Other Ambulatory Visit (RURAL_HEALTH_CENTER): Payer: Self-pay | Admitting: Internal Medicine

## 2022-02-14 DIAGNOSIS — G40909 Epilepsy, unspecified, not intractable, without status epilepticus: Secondary | ICD-10-CM

## 2022-02-16 ENCOUNTER — Telehealth (RURAL_HEALTH_CENTER): Payer: Self-pay | Admitting: Internal Medicine

## 2022-02-20 ENCOUNTER — Other Ambulatory Visit: Payer: Self-pay

## 2022-02-20 ENCOUNTER — Emergency Department: Admission: EM | Admit: 2022-02-20 | Discharge: 2022-02-20 | Discharge status: Against Medical Advice | Payer: 59

## 2022-02-20 DIAGNOSIS — Z5321 Procedure and treatment not carried out due to patient leaving prior to being seen by health care provider: Secondary | ICD-10-CM | POA: Insufficient documentation

## 2022-02-21 ENCOUNTER — Telehealth (RURAL_HEALTH_CENTER): Payer: Self-pay | Admitting: Internal Medicine

## 2022-02-21 MED ORDER — LORAZEPAM 0.5 MG TABLET
0.5000 mg | ORAL_TABLET | Freq: Three times a day (TID) | ORAL | 1 refills | Status: DC
Start: 2022-02-21 — End: 2024-01-29

## 2022-02-22 ENCOUNTER — Other Ambulatory Visit (RURAL_HEALTH_CENTER): Payer: Self-pay | Admitting: Internal Medicine

## 2022-02-25 ENCOUNTER — Other Ambulatory Visit (INDEPENDENT_AMBULATORY_CARE_PROVIDER_SITE_OTHER): Payer: Self-pay | Admitting: Surgery

## 2022-02-27 NOTE — Telephone Encounter (Signed)
Pharmacy is requesting refill on Prilosec, LOV 12-22, okay to refill? Margit Hanks, LPN  12/01/5206, 02:23

## 2022-03-01 ENCOUNTER — Ambulatory Visit (RURAL_HEALTH_CENTER): Payer: 59 | Attending: Internal Medicine | Admitting: Internal Medicine

## 2022-03-01 ENCOUNTER — Encounter (RURAL_HEALTH_CENTER): Payer: Self-pay | Admitting: Internal Medicine

## 2022-03-01 ENCOUNTER — Ambulatory Visit: Payer: 59 | Attending: Internal Medicine | Admitting: Internal Medicine

## 2022-03-01 ENCOUNTER — Other Ambulatory Visit: Payer: Self-pay

## 2022-03-01 VITALS — BP 136/84 | HR 96 | Temp 97.3°F | Ht 65.0 in | Wt 199.0 lb

## 2022-03-01 DIAGNOSIS — G40919 Epilepsy, unspecified, intractable, without status epilepticus: Secondary | ICD-10-CM | POA: Insufficient documentation

## 2022-03-01 DIAGNOSIS — R748 Abnormal levels of other serum enzymes: Secondary | ICD-10-CM | POA: Insufficient documentation

## 2022-03-01 DIAGNOSIS — R519 Headache, unspecified: Secondary | ICD-10-CM | POA: Insufficient documentation

## 2022-03-01 DIAGNOSIS — M1990 Unspecified osteoarthritis, unspecified site: Secondary | ICD-10-CM | POA: Insufficient documentation

## 2022-03-01 DIAGNOSIS — I471 Supraventricular tachycardia: Secondary | ICD-10-CM | POA: Insufficient documentation

## 2022-03-01 DIAGNOSIS — F411 Generalized anxiety disorder: Secondary | ICD-10-CM | POA: Insufficient documentation

## 2022-03-01 DIAGNOSIS — K219 Gastro-esophageal reflux disease without esophagitis: Secondary | ICD-10-CM | POA: Insufficient documentation

## 2022-03-01 LAB — COMPREHENSIVE METABOLIC PANEL, NON-FASTING
ALBUMIN/GLOBULIN RATIO: 2 — ABNORMAL HIGH (ref 0.8–1.4)
ALBUMIN: 4.7 g/dL (ref 3.5–5.7)
ALKALINE PHOSPHATASE: 72 U/L (ref 34–104)
ALT (SGPT): 39 U/L (ref 7–52)
ANION GAP: 6 mmol/L — ABNORMAL LOW (ref 10–20)
AST (SGOT): 21 U/L (ref 13–39)
BILIRUBIN TOTAL: 0.4 mg/dL (ref 0.3–1.2)
BUN/CREA RATIO: 17 (ref 6–22)
BUN: 13 mg/dL (ref 7–25)
CALCIUM, CORRECTED: 8.6 mg/dL — ABNORMAL LOW (ref 8.9–10.8)
CALCIUM: 9.3 mg/dL (ref 8.6–10.3)
CHLORIDE: 104 mmol/L (ref 98–107)
CO2 TOTAL: 27 mmol/L (ref 21–31)
CREATININE: 0.75 mg/dL (ref 0.60–1.30)
ESTIMATED GFR: 122 mL/min/{1.73_m2} (ref >59)
GLOBULIN: 2.3 — ABNORMAL LOW (ref 2.9–5.4)
GLUCOSE: 89 mg/dL (ref 74–109)
OSMOLALITY, CALCULATED: 273 mOsm/kg (ref 270–290)
POTASSIUM: 3.9 mmol/L (ref 3.5–5.1)
PROTEIN TOTAL: 7 g/dL (ref 6.4–8.9)
SODIUM: 137 mmol/L (ref 136–145)

## 2022-03-01 LAB — LIPID PANEL
CHOL/HDL RATIO: 4.5
CHOLESTEROL: 197 mg/dL (ref 136–290)
HDL CHOL: 44 mg/dL (ref 23–92)
LDL CALC: 134 mg/dL — ABNORMAL HIGH (ref 0–100)
TRIGLYCERIDES: 97 mg/dL (ref <=150)
VLDL CALC: 19 mg/dL (ref 0–50)

## 2022-03-01 LAB — THYROID STIMULATING HORMONE (SENSITIVE TSH): TSH: 0.649 u[IU]/mL (ref 0.450–5.330)

## 2022-03-01 MED ORDER — PREDNISONE 20 MG TABLET
20.0000 mg | ORAL_TABLET | Freq: Two times a day (BID) | ORAL | 0 refills | Status: AC
Start: 2022-03-01 — End: 2022-03-06

## 2022-03-01 MED ORDER — AMOXICILLIN 875 MG-POTASSIUM CLAVULANATE 125 MG TABLET
1.0000 | ORAL_TABLET | Freq: Two times a day (BID) | ORAL | 0 refills | Status: AC
Start: 2022-03-01 — End: 2022-03-11

## 2022-03-01 MED ORDER — MONTELUKAST 10 MG TABLET
10.0000 mg | ORAL_TABLET | Freq: Every evening | ORAL | 3 refills | Status: AC
Start: 2022-03-01 — End: 2023-08-01

## 2022-03-01 MED ORDER — FLUTICASONE PROPIONATE 50 MCG/ACTUATION NASAL SPRAY,SUSPENSION
2.0000 | Freq: Every day | NASAL | 3 refills | Status: AC
Start: 2022-03-01 — End: 2022-05-30

## 2022-03-01 NOTE — Progress Notes (Signed)
Name: Andrew Collier                       Date of Birth: 10-05-1986   MRN:  [id number]                         Date of visit: 03/01/2022     PCP: Andrew Cota, PA-C     Subjective  STEPFON Collier is a 36 y.o. year old male who presents for Follow Up 6 Months (Allergy  test at home negative  has ha/Wants to know of eye drop OTC for dry eyes)   to clinic.  No specialty comments available.   Patient Active Problem List    Diagnosis Date Noted   . 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 Epilepsy (CMS HCC) 01/26/2022   . GAD (generalized anxiety disorder) 01/26/2022   . Osteoarthritis 01/26/2022   . Paroxysmal supraventricular tachycardia (CMS HCC) 01/26/2022   . Thoracic back pain 01/26/2022   . Diarrhea 01/26/2022      Current Outpatient Medications   Medication Sig   . amoxicillin-pot clavulanate (AUGMENTIN) 875-125 mg Oral Tablet Take 1 Tablet by mouth Twice daily for 10 days   . dicyclomine (BENTYL) 10 mg Oral Capsule Take 1 Capsule (10 mg total) by mouth Four times a day   . fluticasone propionate (FLONASE) 50 mcg/actuation Nasal Spray, Suspension Administer 2 Sprays into each nostril Once a day for 90 days   . Ibuprofen (MOTRIN) 800 mg Oral Tablet TAKE 1 TABLET BY MOUTH 3 TIMES A DAY AS NEEDED FOR PAIN   . Levocetirizine (XYZAL) 5 mg Oral Tablet Take 1 Tablet (5 mg total) by mouth Every evening   . LORazepam (ATIVAN) 0.5 mg Oral Tablet Take 1 Tablet (0.5 mg total) by mouth Three times a day   . metoprolol succinate (TOPROL-XL) 25 mg Oral Tablet Sustained Release 24 hr Take 1 Tablet (25 mg total) by mouth Once a day   . montelukast (SINGULAIR) 10 mg Oral Tablet Take 1 Tablet (10 mg total) by mouth Every evening for 90 days   . omeprazole (PRILOSEC) 20 mg Oral Capsule, Delayed Release(E.C.) TAKE 1 CAPSULE BY MOUTH TWICE A DAY FOR GASTRITIS   . sucralfate (CARAFATE) 1 gram Oral Tablet Take 1 Tablet (1 g total) by mouth  Twice a day before meals            Covid  19 neg at home  Checked due to ha           Chronic Disease Management-AMB  Follow Up  Visit     This  36 yo is being seen  for   chronic medical problems     Covid  19 neg at home  Checked due to ha  Sinus pressure   X  1 week  No vision changes  Nothing makes better   Just started allergy meds       follow up   anxiety and depression still on  lexapro   and  overall doing  well   Ativan prn  Pt  Last used one days ago     fu  seizure disorder   followed  with dr Marquis Buggy  whom has retired  will do referral to Dr Andee Poles     fu abd  pain and spasms in abd   uses bentyl  prn  On  PPI   And  Carafate  Ok  To drop  Carafate one at a time to see if still needed     fu  renal stones  No new symptoms     Fu  Elevated LFT    Will fu labs             REVIEW OF SYSTEMS:   Review of Systems  General: No fever.  No chills.  No weight changes.  HEENT: No vision changes.   + HA  Cardiac: No chest pain. No palpitations.  No dizziness.  No light-headedness.  No near syncope.  Resp: No dyspnea at rest, no dyspnea on exertion; no cough or hemoptysis; no orthopnea or PND.  GI: No N/V. No melena.  No bright red blood per rectum.  Ext: No edema.  No claudication.  Neuro: No focal weakness.  No numbness.  All other ROS negative.      Objective: BP 136/84 (Site: Left, Patient Position: Sitting)   Pulse 96   Temp 36.3 C (97.3 F) (Tympanic)   Ht 1.651 m (5\' 5" )   Wt 90.3 kg (199 lb)   SpO2 94%   BMI 33.12 kg/m              PHYSICAL EXAM  Physical Exam  Gen: NAD. Alert.   HEENT: PERRL; conjuntiva clear. No JVD or carotid bruit.  Sinus pressure bilateral   Cardiac: RRR with normal S1, S2.   Lungs: Clear to auscultation bilaterally. No rales. No wheezing. No rhonci.  Neurologic:  Grossly intact  Assessment/Plan  Assessment/Plan   1. Intractable epilepsy without status epilepticus, unspecified epilepsy type (CMS HCC)    2. Headache    3. Elevated liver enzymes    4. Osteoarthritis, unspecified  osteoarthritis type, unspecified site    5. GAD (generalized anxiety disorder)    6. Paroxysmal supraventricular tachycardia (CMS HCC)    7. Gastroesophageal reflux disease without esophagitis       Sinus vs  Migraine  Ha  Will start  Augmentin   875 and  Prednisone   Hold  Motrin  ifno better will plan to  Xray and treat  Migraines   Awaiting appt with neurology  Dr  To [facility]   Meds reviewed as well as labs.  Chart reviewed and updated.   Continue current treatment.  Keep follow-up appointment.   Vaccine hx reviewed.

## 2022-03-21 ENCOUNTER — Inpatient Hospital Stay (HOSPITAL_COMMUNITY): Payer: Auto Insurance (includes no fault)

## 2022-03-21 ENCOUNTER — Other Ambulatory Visit: Payer: Self-pay

## 2022-03-21 ENCOUNTER — Encounter (HOSPITAL_COMMUNITY): Payer: Self-pay | Admitting: Emergency Medicine

## 2022-03-21 ENCOUNTER — Emergency Department
Admission: EM | Admit: 2022-03-21 | Discharge: 2022-03-21 | Disposition: A | Discharge status: Home / Self Care | Payer: Auto Insurance (includes no fault) | Attending: Emergency Medicine | Admitting: Emergency Medicine

## 2022-03-21 ENCOUNTER — Emergency Department (HOSPITAL_COMMUNITY): Payer: Auto Insurance (includes no fault)

## 2022-03-21 DIAGNOSIS — Y92410 Unspecified street and highway as the place of occurrence of the external cause: Secondary | ICD-10-CM | POA: Insufficient documentation

## 2022-03-21 DIAGNOSIS — S161XXA Strain of muscle, fascia and tendon at neck level, initial encounter: Secondary | ICD-10-CM | POA: Insufficient documentation

## 2022-03-21 DIAGNOSIS — S46912A Strain of unspecified muscle, fascia and tendon at shoulder and upper arm level, left arm, initial encounter: Secondary | ICD-10-CM | POA: Insufficient documentation

## 2022-03-21 MED ORDER — METHOCARBAMOL 500 MG TABLET
ORAL_TABLET | ORAL | Status: AC
Start: 2022-03-21 — End: 2022-03-21
  Filled 2022-03-21: qty 1

## 2022-03-21 MED ORDER — KETOROLAC 60 MG/2 ML INTRAMUSCULAR SOLUTION
60.0000 mg | INTRAMUSCULAR | Status: AC
Start: 2022-03-21 — End: 2022-03-21
  Administered 2022-03-21: 60 mg via INTRAMUSCULAR

## 2022-03-21 MED ORDER — METHOCARBAMOL 500 MG TABLET
500.0000 mg | ORAL_TABLET | ORAL | Status: AC
Start: 2022-03-21 — End: 2022-03-21
  Administered 2022-03-21: 500 mg via ORAL

## 2022-03-21 MED ORDER — METHOCARBAMOL 500 MG TABLET
500.0000 mg | ORAL_TABLET | Freq: Three times a day (TID) | ORAL | 0 refills | Status: AC | PRN
Start: 2022-03-21 — End: 2022-03-26

## 2022-03-21 MED ORDER — KETOROLAC 60 MG/2 ML INTRAMUSCULAR SOLUTION
INTRAMUSCULAR | Status: AC
Start: 2022-03-21 — End: 2022-03-21
  Filled 2022-03-21: qty 2

## 2022-03-21 NOTE — ED Nurses Note (Signed)
Patient seen, treated, and discharged by ER provider prior to being assigned to a primary nurse. No primary nursing assessment done.
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
## 2022-03-21 NOTE — ED Nurses Note (Signed)
Patient discharged home with family.  AVS reviewed with patient.  A written copy of the AVS and discharge instructions was given to the patient.  Questions sufficiently answered as needed.  Patient encouraged to follow up with PCP as indicated.  In the event of an emergency, patient instructed to call 911 or go to the nearest emergency room.
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
## 2022-03-21 NOTE — Discharge Instructions (Signed)
Take Robaxin as needed for muscle spasm  Take ibuprofen as needed for discomfort  Follow-up with family doctor for recheck in 2-3 days  Return to the emergency room for any increased pain, numbness, tingling, or any concerns

## 2022-04-14 ENCOUNTER — Other Ambulatory Visit (HOSPITAL_COMMUNITY): Payer: Self-pay

## 2022-04-14 DIAGNOSIS — Z96642 Presence of left artificial hip joint: Secondary | ICD-10-CM

## 2022-04-14 DIAGNOSIS — M25562 Pain in left knee: Secondary | ICD-10-CM

## 2022-04-19 ENCOUNTER — Other Ambulatory Visit: Payer: Self-pay

## 2022-04-19 ENCOUNTER — Other Ambulatory Visit (HOSPITAL_COMMUNITY): Payer: Self-pay

## 2022-04-19 ENCOUNTER — Inpatient Hospital Stay
Admission: RE | Admit: 2022-04-19 | Discharge: 2022-04-19 | Disposition: A | Discharge status: Home / Self Care | Payer: 59 | Source: Ambulatory Visit

## 2022-04-19 DIAGNOSIS — G8929 Other chronic pain: Secondary | ICD-10-CM | POA: Insufficient documentation

## 2022-04-19 DIAGNOSIS — M25552 Pain in left hip: Secondary | ICD-10-CM | POA: Insufficient documentation

## 2022-04-19 DIAGNOSIS — Z96642 Presence of left artificial hip joint: Secondary | ICD-10-CM | POA: Insufficient documentation

## 2022-04-19 DIAGNOSIS — M25562 Pain in left knee: Secondary | ICD-10-CM

## 2022-04-20 IMAGING — MR MRI SHOULDER LT W/O CONTRAST
6 series · 38 of 40 positions shown · IV contrast (gadolinium)
Comparison: MRI dated 04/08/2020 and radiographs dated 08/02/2016.

﻿EXAM:  45002   MRI SHOULDER LT W/O CONTRAST
INDICATION: Pain and decreased range of motion.
TECHNIQUE: Multiplanar multisequential MRI of the left shoulder joint was performed without gadolinium contrast.

[Series 6: T1 · oblique · left · 3.5mm · 0.33mm/px · 7 of 18 slices shown]
[im 1/18]
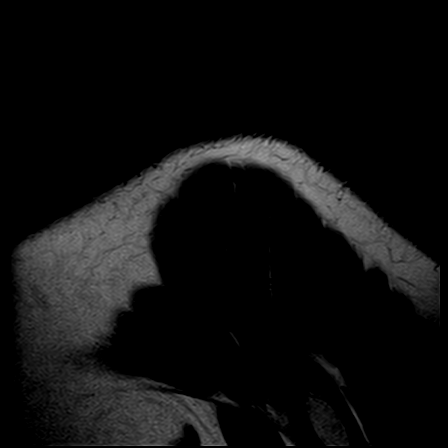
[im 3/18]
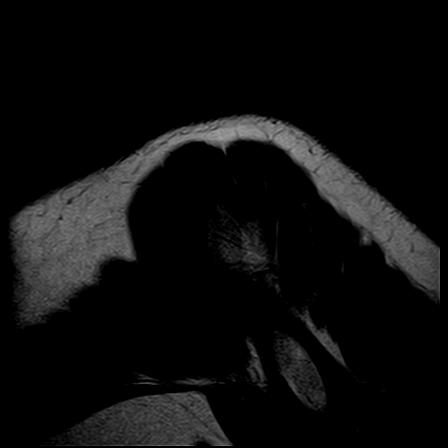
[im 6/18]
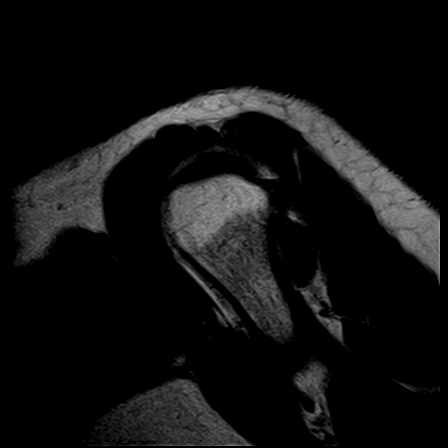
[im 9/18]
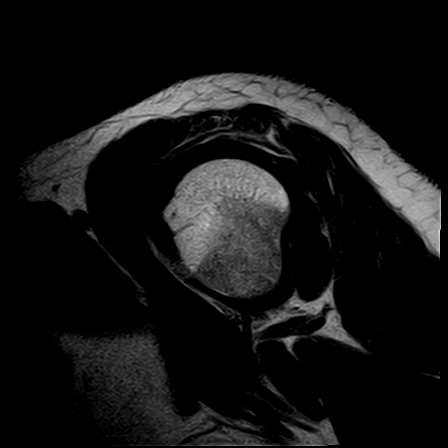
[im 12/18]
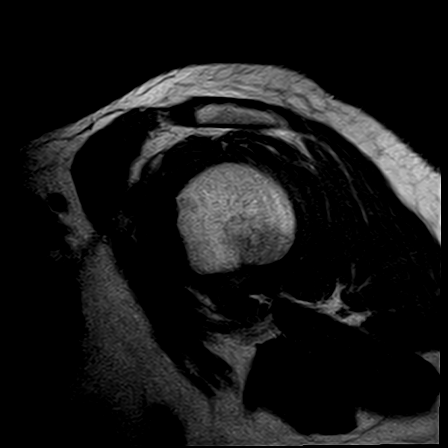
[im 15/18]
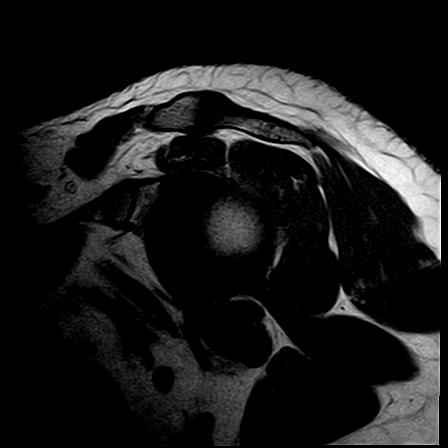
[im 18/18]
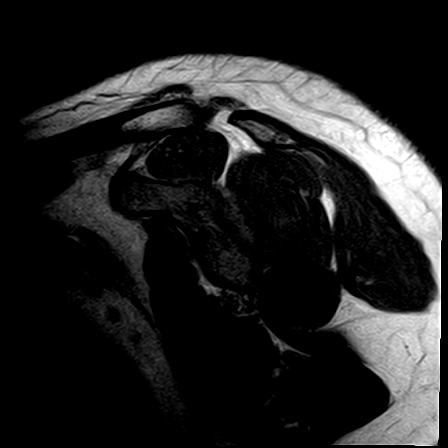

[Series 7: STIR · oblique · left · 3.5mm · 0.47mm/px · 7 of 18 slices shown (1 of 2)]
[im 1/18]
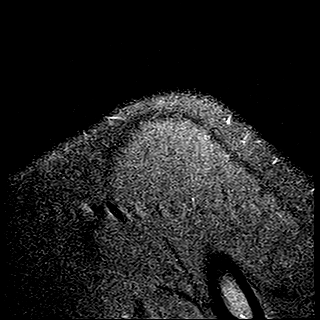
[im 3/18]
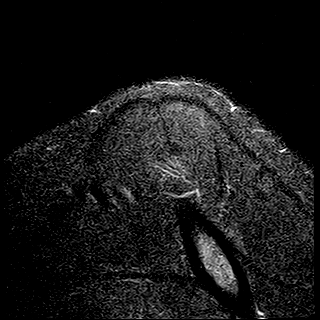
[im 6/18]
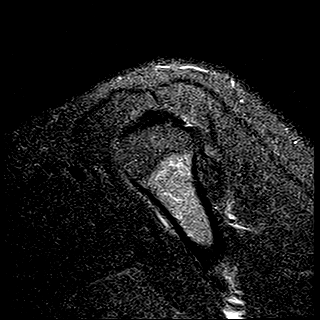
[im 9/18]
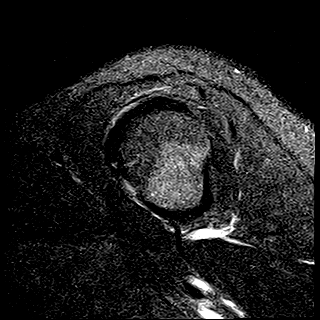
[im 12/18]
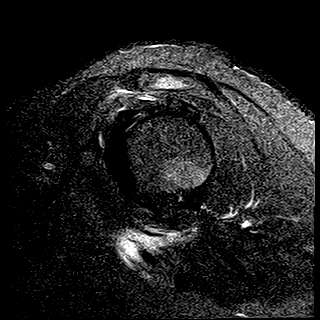
[im 15/18]
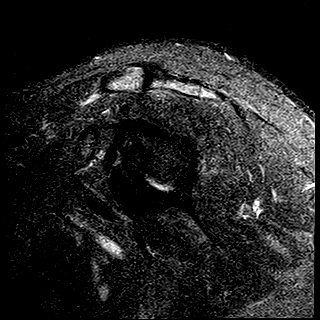
[im 18/18]
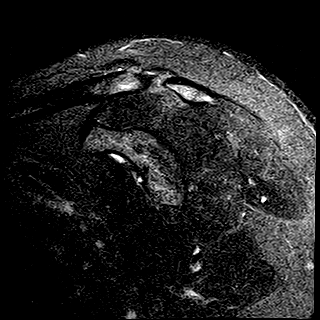

[Series 8: T2 fat-sat · axial · left · 4.0mm · 0.42mm/px · z∈[-77,+27]mm · 8 of 24 slices shown]
[im 1/24]
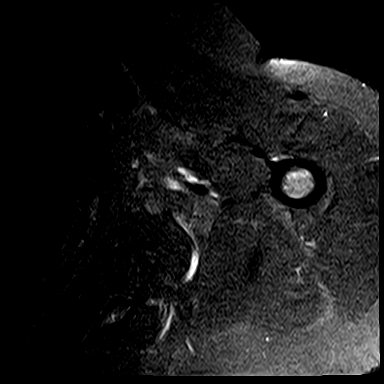
[im 4/24]
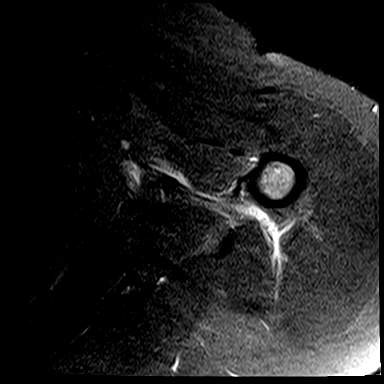
[im 7/24]
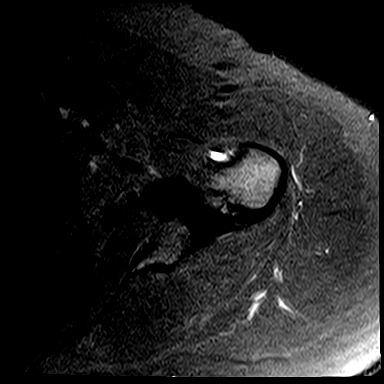
[im 10/24]
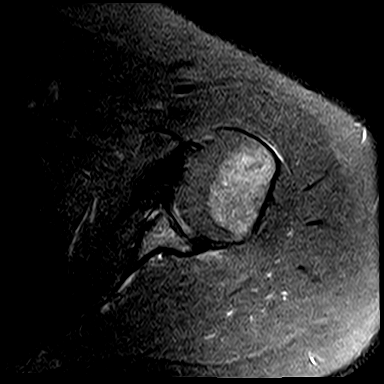
[im 14/24]
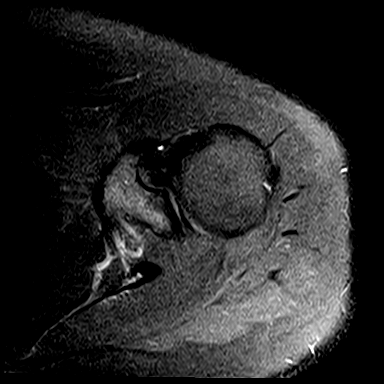
[im 17/24]
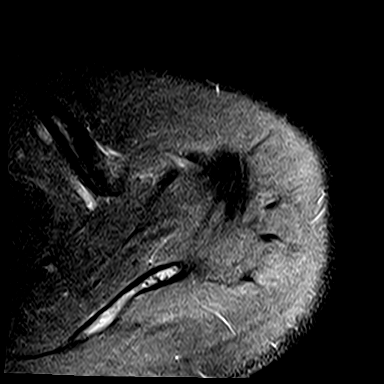
[im 20/24]
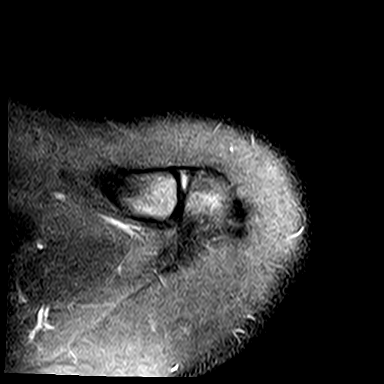
[im 24/24]
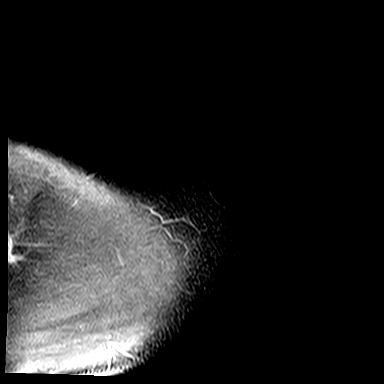

[Series 9: PD fat-sat · axial · left · 4.0mm · 0.50mm/px · z∈[-63,+13]mm · 6 of 18 slices shown (1 of 2)]
[im 1/18]
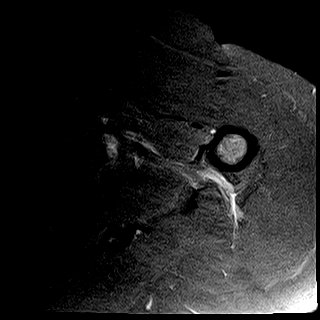
[im 4/18]
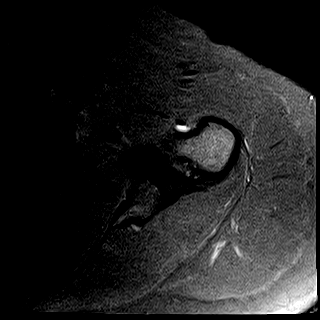
[im 7/18]
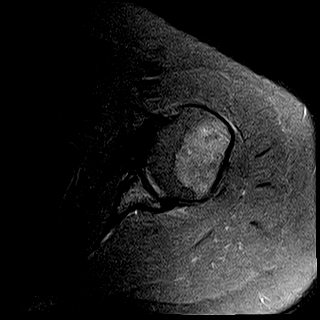
[im 11/18]
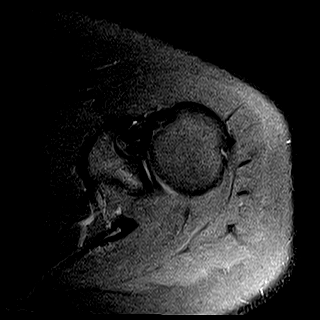
[im 14/18]
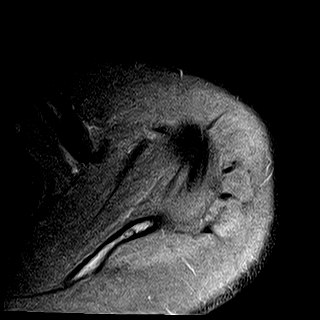
[im 18/18]
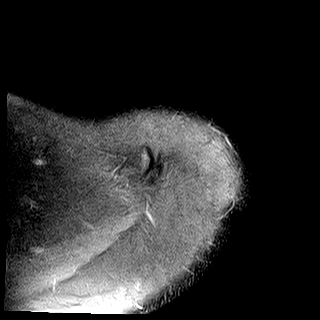

[Series 10: PD fat-sat · oblique · left · 3.5mm · 0.53mm/px · 6 of 18 slices shown (2 of 2)]
[im 1/18]
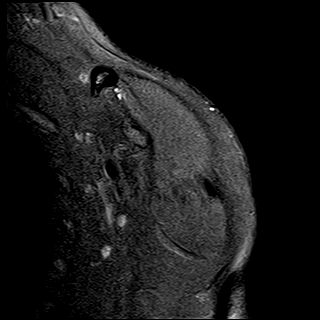
[im 4/18]
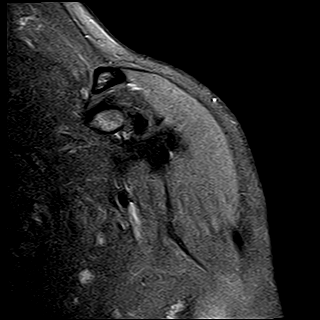
[im 7/18]
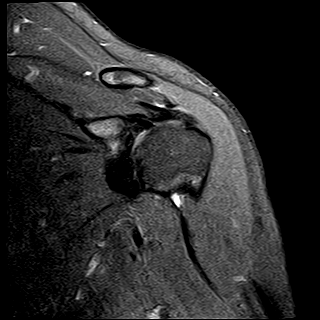
[im 11/18]
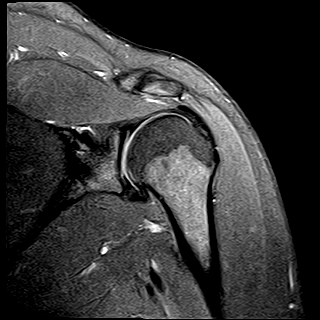
[im 14/18]
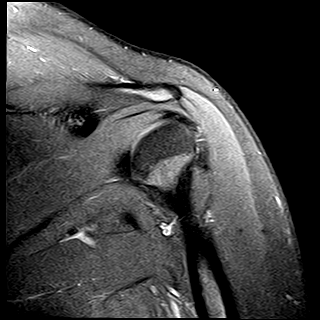
[im 18/18]
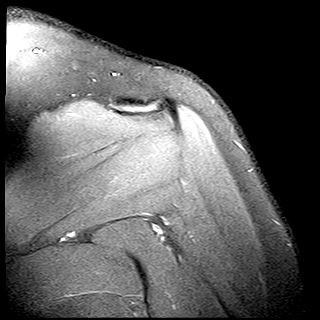

[Series 11: STIR · oblique · left · 3.5mm · 0.53mm/px · 4 of 18 slices shown (2 of 2)]
[im 1/18]
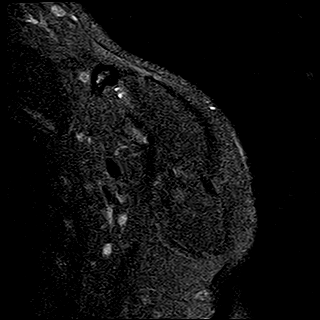
[im 4/18]
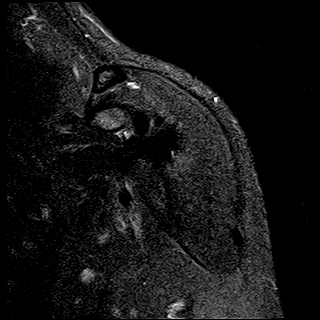
[im 7/18]
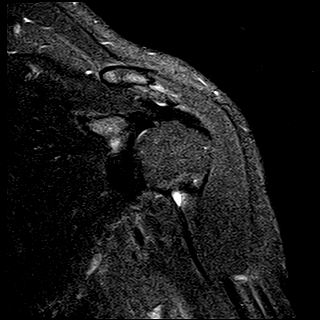
[im 11/18]
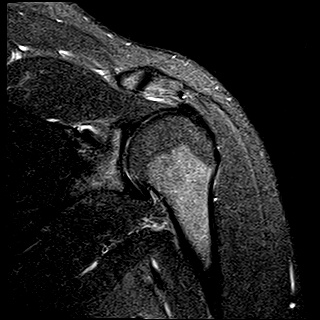

[38 of 40 positions shown; findings below may reference images not displayed]

FINDINGS: Supraspinatus, infraspinatus, teres minor and subscapularis muscles and tendons are within normal limits in morphology and signal intensity.  Long head of biceps tendon is well seated within the intertubercular groove and attaches normally to the biceps anchor. Superior labrum is intact. Extensive anterior labral tear with subarticular degenerative cystic changes within the anterior glenoid are identified.  There is mild thinning of glenohumeral articular cartilage.  Acromioclavicular joint is unremarkable. There is no significant fluid within the subacromial/subdeltoid bursa. Muscle bulk and bone marrow signal intensity are normal.  No mass is seen along the course of the suprascapular nerve, within the spinoglenoid notch or within the quadrilateral space.
IMPRESSION: 1. Stable extensive anterior labral tear.  

2. Intact rotator cuff.  

3. Unremarkable acromioclavicular joint.

## 2022-05-16 ENCOUNTER — Other Ambulatory Visit (INDEPENDENT_AMBULATORY_CARE_PROVIDER_SITE_OTHER): Payer: Self-pay | Admitting: Internal Medicine

## 2022-05-19 ENCOUNTER — Other Ambulatory Visit (INDEPENDENT_AMBULATORY_CARE_PROVIDER_SITE_OTHER): Payer: Self-pay | Admitting: Internal Medicine

## 2022-05-19 MED ORDER — ONDANSETRON 4 MG DISINTEGRATING TABLET
4.0000 mg | ORAL_TABLET | Freq: Three times a day (TID) | ORAL | 0 refills | Status: DC | PRN
Start: 2022-05-19 — End: 2023-05-28

## 2022-07-16 ENCOUNTER — Other Ambulatory Visit (INDEPENDENT_AMBULATORY_CARE_PROVIDER_SITE_OTHER): Payer: Self-pay | Admitting: Internal Medicine

## 2022-07-16 DIAGNOSIS — G40909 Epilepsy, unspecified, not intractable, without status epilepticus: Secondary | ICD-10-CM

## 2022-07-24 ENCOUNTER — Telehealth (INDEPENDENT_AMBULATORY_CARE_PROVIDER_SITE_OTHER): Payer: Self-pay | Admitting: Internal Medicine

## 2022-08-14 ENCOUNTER — Encounter (INDEPENDENT_AMBULATORY_CARE_PROVIDER_SITE_OTHER): Payer: Self-pay | Admitting: Internal Medicine

## 2022-08-14 ENCOUNTER — Ambulatory Visit: Payer: 59 | Attending: Internal Medicine | Admitting: Internal Medicine

## 2022-08-14 ENCOUNTER — Other Ambulatory Visit: Payer: Self-pay

## 2022-08-14 VITALS — BP 138/72 | HR 86 | Temp 97.9°F | Resp 96 | Ht 65.0 in

## 2022-08-14 DIAGNOSIS — K219 Gastro-esophageal reflux disease without esophagitis: Secondary | ICD-10-CM | POA: Insufficient documentation

## 2022-08-14 DIAGNOSIS — I471 Supraventricular tachycardia: Secondary | ICD-10-CM | POA: Insufficient documentation

## 2022-08-14 DIAGNOSIS — R197 Diarrhea, unspecified: Secondary | ICD-10-CM | POA: Insufficient documentation

## 2022-08-14 DIAGNOSIS — R209 Unspecified disturbances of skin sensation: Secondary | ICD-10-CM | POA: Insufficient documentation

## 2022-08-14 DIAGNOSIS — R748 Abnormal levels of other serum enzymes: Secondary | ICD-10-CM | POA: Insufficient documentation

## 2022-08-14 DIAGNOSIS — F411 Generalized anxiety disorder: Secondary | ICD-10-CM | POA: Insufficient documentation

## 2022-08-14 MED ORDER — DICYCLOMINE 10 MG CAPSULE
10.0000 mg | ORAL_CAPSULE | Freq: Four times a day (QID) | ORAL | 3 refills | Status: DC
Start: 2022-08-14 — End: 2022-08-14

## 2022-08-14 MED ORDER — MELOXICAM 15 MG TABLET
15.0000 mg | ORAL_TABLET | Freq: Every day | ORAL | 0 refills | Status: AC
Start: 2022-08-14 — End: ?

## 2022-08-14 MED ORDER — DICYCLOMINE 10 MG CAPSULE
10.0000 mg | ORAL_CAPSULE | Freq: Four times a day (QID) | ORAL | 3 refills | Status: AC | PRN
Start: 2022-08-14 — End: 2022-11-12

## 2022-08-14 MED ORDER — LEVOCETIRIZINE 5 MG TABLET
5.0000 mg | ORAL_TABLET | Freq: Every evening | ORAL | 3 refills | Status: AC
Start: 2022-08-14 — End: ?

## 2022-08-14 NOTE — Progress Notes (Unsigned)
Name: Andrew Collier                       Date of Birth: [birth year]   MRN:  [id number]                         Date of visit: 08/14/2022     PCP: Delaney Meigs, PA-C     Subjective  BRENTLEE DELAGE is a 36 y.o. year old male who presents for Follow Up 4 Months (No problems)   to clinic.  No specialty comments available.   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    Epilepsy (CMS [place]) 01/26/2022    GAD (generalized anxiety disorder) 01/26/2022    Osteoarthritis 01/26/2022    Paroxysmal supraventricular tachycardia (CMS HCC) 01/26/2022    Thoracic back pain 01/26/2022    Diarrhea 01/26/2022      Current Outpatient Medications   Medication Sig    dicyclomine (BENTYL) 10 mg Oral Capsule Take 1 Capsule (10 mg total) by mouth Four times a day as needed for up to 90 days    Ibuprofen (MOTRIN) 800 mg Oral Tablet TAKE 1 TABLET BY MOUTH 3 TIMES A DAY AS NEEDED FOR PAIN    lamoTRIgine (LAMICTAL) 200 mg Oral Tablet TAKE 1 TABLET BY MOUTH TWICE A DAY FOR MOOD DISORDER    Levocetirizine (XYZAL) 5 mg Oral Tablet Take 1 Tablet (5 mg total) by mouth Every evening    LORazepam (ATIVAN) 0.5 mg Oral Tablet Take 1 Tablet (0.5 mg total) by mouth Three times a day    meloxicam (MOBIC) 15 mg Oral Tablet Take 1 Tablet (15 mg total) by mouth Once a day    metoprolol succinate (TOPROL-XL) 25 mg Oral Tablet Sustained Release 24 hr Take 1 Tablet (25 mg total) by mouth Once a day    montelukast (SINGULAIR) 10 mg Oral Tablet Take 1 Tablet (10 mg total) by mouth Every evening for 90 days    omeprazole (PRILOSEC) 20 mg Oral Capsule, Delayed Release(E.C.) TAKE 1 CAPSULE BY MOUTH TWICE A DAY FOR GASTRITIS    ondansetron (ZOFRAN ODT) 4 mg Oral Tablet, Rapid Dissolve Take 1 Tablet (4 mg total) by mouth Every 8 hours as needed    sucralfate (CARAFATE) 1 gram Oral Tablet Take 1 Tablet (1 g total) by mouth Twice  a day before meals        This 36 yo is here for routine  fu     Co walking and randomly he gets  numbness tingling down left leg    occ  weakness in leg    No  physical back pain       Fu AR pt is on xyxal      Fu Seizure disorder  Pt has not had a seizure in over 5 yrs  Pt has been doing well with current tx   Pt was followed by Dr Scarlette Calico before he retired    Pt has upcoming appt with Dr Hannah Beat GERD pt is on Prilosec  and  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  No chills.  No weight changes.  HEENT: No vision changes.  Cardiac: No chest pain. No palpitations.  No dizziness.  No light-headedness.  No near syncope.  Resp: No dyspnea at rest, no dyspnea on exertion; no cough or hemoptysis; no orthopnea or PND.  GI: No N/V. No melena.  No bright red blood per rectum.  Ext: No edema.  No claudication.  Neuro: No focal weakness.  No numbness.  All other ROS negative.      Objective:   BP 138/72 (Site: Left, Patient Position: Sitting)   Pulse 86   Temp 36.6 C (97.9 F) (Tympanic)   Resp (!) 96   Ht 1.651 m (5\' 5" )   BMI 34.95 kg/m              PHYSICAL EXAM  Physical Exam  Gen: NAD. Alert.   HEENT: PERRL; conjunctivae clear. No JVD or carotid bruit.  Cardiac: RRR with normal S1, S2.   Lungs: Clear to auscultation bilaterally. No rales. No wheezing. No rhonchi.  Abdomen: Soft, non-tender.non-distended  nl bowel sounds    Extremities: No edema. No cyanosis. No clubbing.  Neurologic:  Grossly intact  Assessment/Plan  Assessment/Plan   1. Gastroesophageal reflux disease without esophagitis    2. Elevated liver enzymes    3. Diarrhea, unspecified type    4. GAD (generalized anxiety disorder)    5. Paroxysmal supraventricular tachycardia (CMS HCC)    6. Skin sensation disturbance        Meds reviewed as well as  labs.  Chart reviewed and updated.   Continue current treatment.  Keep follow-up appointment.   Vaccine hx reviewed.   To see Dr Tobie Poet soon   Last  seizure >  72yrs ago   Mobic   15  daily  1 week than prn  for  left   hip and leg pain and numbness     May need further work up

## 2022-08-16 ENCOUNTER — Encounter (INDEPENDENT_AMBULATORY_CARE_PROVIDER_SITE_OTHER): Payer: Self-pay | Admitting: NEUROLOGY

## 2022-08-25 ENCOUNTER — Ambulatory Visit (INDEPENDENT_AMBULATORY_CARE_PROVIDER_SITE_OTHER): Payer: 59 | Admitting: NEUROLOGY

## 2022-09-25 ENCOUNTER — Telehealth (INDEPENDENT_AMBULATORY_CARE_PROVIDER_SITE_OTHER): Payer: Self-pay | Admitting: Internal Medicine

## 2022-09-25 MED ORDER — AZITHROMYCIN 250 MG TABLET
ORAL_TABLET | ORAL | 0 refills | Status: DC
Start: 2022-09-25 — End: 2023-02-16

## 2022-09-25 MED ORDER — BENZONATATE 100 MG CAPSULE
100.0000 mg | ORAL_CAPSULE | Freq: Three times a day (TID) | ORAL | 0 refills | Status: DC
Start: 2022-09-25 — End: 2023-02-16

## 2022-09-26 ENCOUNTER — Telehealth (INDEPENDENT_AMBULATORY_CARE_PROVIDER_SITE_OTHER): Payer: Self-pay | Admitting: Internal Medicine

## 2022-09-26 MED ORDER — PREDNISONE 10 MG TABLETS IN A DOSE PACK
ORAL_TABLET | ORAL | 1 refills | Status: DC
Start: 2022-09-26 — End: 2023-02-16

## 2022-11-17 ENCOUNTER — Ambulatory Visit (INDEPENDENT_AMBULATORY_CARE_PROVIDER_SITE_OTHER): Payer: Self-pay | Admitting: NEUROLOGY

## 2023-02-12 ENCOUNTER — Ambulatory Visit (INDEPENDENT_AMBULATORY_CARE_PROVIDER_SITE_OTHER): Payer: 59 | Admitting: Internal Medicine

## 2023-02-14 ENCOUNTER — Telehealth (INDEPENDENT_AMBULATORY_CARE_PROVIDER_SITE_OTHER): Payer: Self-pay | Admitting: Internal Medicine

## 2023-02-14 ENCOUNTER — Other Ambulatory Visit (INDEPENDENT_AMBULATORY_CARE_PROVIDER_SITE_OTHER): Payer: Self-pay | Admitting: Internal Medicine

## 2023-02-14 DIAGNOSIS — R197 Diarrhea, unspecified: Secondary | ICD-10-CM

## 2023-02-15 NOTE — Telephone Encounter (Signed)
Patient coming tom

## 2023-02-16 ENCOUNTER — Encounter (INDEPENDENT_AMBULATORY_CARE_PROVIDER_SITE_OTHER): Payer: Self-pay | Admitting: Internal Medicine

## 2023-02-16 ENCOUNTER — Other Ambulatory Visit (HOSPITAL_COMMUNITY): Payer: 59

## 2023-02-16 ENCOUNTER — Ambulatory Visit: Payer: 59 | Attending: Internal Medicine | Admitting: Internal Medicine

## 2023-02-16 ENCOUNTER — Other Ambulatory Visit: Payer: Self-pay

## 2023-02-16 VITALS — BP 122/86 | HR 80 | Temp 97.5°F | Ht 65.0 in | Wt 214.4 lb

## 2023-02-16 DIAGNOSIS — R140 Abdominal distension (gaseous): Secondary | ICD-10-CM

## 2023-02-16 DIAGNOSIS — R635 Abnormal weight gain: Secondary | ICD-10-CM

## 2023-02-16 DIAGNOSIS — R197 Diarrhea, unspecified: Secondary | ICD-10-CM

## 2023-02-16 DIAGNOSIS — M1990 Unspecified osteoarthritis, unspecified site: Secondary | ICD-10-CM

## 2023-02-16 DIAGNOSIS — F411 Generalized anxiety disorder: Secondary | ICD-10-CM

## 2023-02-16 LAB — COMPREHENSIVE METABOLIC PNL, FASTING
ALBUMIN/GLOBULIN RATIO: 2.1 — ABNORMAL HIGH (ref 0.8–1.4)
ALBUMIN: 4.8 g/dL (ref 3.5–5.7)
ALKALINE PHOSPHATASE: 62 U/L (ref 34–104)
ALT (SGPT): 45 U/L (ref 7–52)
ANION GAP: 7 mmol/L (ref 4–13)
AST (SGOT): 26 U/L (ref 13–39)
BILIRUBIN TOTAL: 0.5 mg/dL (ref 0.3–1.2)
BUN/CREA RATIO: 15 (ref 6–22)
BUN: 11 mg/dL (ref 7–25)
CALCIUM, CORRECTED: 9.1 mg/dL (ref 8.9–10.8)
CALCIUM: 9.7 mg/dL (ref 8.6–10.3)
CHLORIDE: 107 mmol/L (ref 98–107)
CO2 TOTAL: 24 mmol/L (ref 21–31)
CREATININE: 0.75 mg/dL (ref 0.60–1.30)
ESTIMATED GFR: 121 mL/min/{1.73_m2} (ref >59)
GLOBULIN: 2.3 — ABNORMAL LOW (ref 2.9–5.4)
GLUCOSE: 93 mg/dL (ref 74–109)
OSMOLALITY, CALCULATED: 275 mOsm/kg (ref 270–290)
POTASSIUM: 3.8 mmol/L (ref 3.5–5.1)
PROTEIN TOTAL: 7.1 g/dL (ref 6.4–8.9)
SODIUM: 138 mmol/L (ref 136–145)

## 2023-02-16 LAB — CBC WITH DIFF
BASOPHIL #: 0 10*3/uL (ref 0.00–0.10)
BASOPHIL %: 1 % (ref 0–1)
EOSINOPHIL #: 0.4 10*3/uL (ref 0.00–0.50)
EOSINOPHIL %: 4 %
HCT: 48.9 % — ABNORMAL HIGH (ref 36.7–47.1)
HGB: 16.8 g/dL — ABNORMAL HIGH (ref 12.5–16.3)
LYMPHOCYTE #: 2.3 10*3/uL (ref 1.00–3.00)
LYMPHOCYTE %: 23 % (ref 16–44)
MCH: 31.2 pg (ref 23.8–33.4)
MCHC: 34.5 g/dL (ref 32.5–36.3)
MCV: 90.4 fL (ref 73.0–96.2)
MONOCYTE #: 0.8 10*3/uL (ref 0.30–1.00)
MONOCYTE %: 8 % (ref 5–13)
MPV: 8.3 fL (ref 7.4–11.4)
NEUTROPHIL #: 6.5 10*3/uL (ref 1.85–7.80)
NEUTROPHIL %: 65 % (ref 43–77)
PLATELETS: 330 10*3/uL (ref 140–440)
RBC: 5.4 10*6/uL (ref 4.06–5.63)
RDW: 13.2 % (ref 12.1–16.2)
WBC: 10 10*3/uL (ref 3.6–10.2)

## 2023-02-16 LAB — LIPID PANEL
CHOL/HDL RATIO: 5
CHOLESTEROL: 200 mg/dL — ABNORMAL HIGH (ref <200)
HDL CHOL: 40 mg/dL (ref 23–92)
LDL CALC: 143 mg/dL — ABNORMAL HIGH (ref 0–100)
TRIGLYCERIDES: 85 mg/dL (ref <=150)
VLDL CALC: 17 mg/dL (ref 0–50)

## 2023-02-16 LAB — THYROID STIMULATING HORMONE (SENSITIVE TSH): TSH: 0.853 u[IU]/mL (ref 0.450–5.330)

## 2023-02-16 LAB — C. DIFFICILE PCR
C. DIFFICILE TOXIN GENE, PCR: NEGATIVE
PRESUMPTIVE 027/NAP1/BI: NEGATIVE

## 2023-02-16 MED ORDER — METRONIDAZOLE 500 MG TABLET
500.0000 mg | ORAL_TABLET | Freq: Three times a day (TID) | ORAL | 0 refills | Status: AC
Start: 2023-02-16 — End: 2023-02-23

## 2023-02-16 NOTE — Progress Notes (Unsigned)
Name: Andrew Collier                       Date of Birth: Oct 31, 1986   MRN:  [id number]                         Date of visit: 02/16/2023     PCP: Delaney Meigs, PA-C     Subjective  Andrew Collier is a 37 y.o. year old male who presents for Referrals (Wants referral to gi in [place])   to clinic.  No specialty comments available.   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    Epilepsy (CMS [place]) 01/26/2022    GAD (generalized anxiety disorder) 01/26/2022    Osteoarthritis 01/26/2022    Paroxysmal supraventricular tachycardia 01/26/2022    Thoracic back pain 01/26/2022    Diarrhea 01/26/2022      Current Outpatient Medications   Medication Sig    Ibuprofen (MOTRIN) 800 mg Oral Tablet TAKE 1 TABLET BY MOUTH 3 TIMES A DAY AS NEEDED FOR PAIN    lamoTRIgine (LAMICTAL) 200 mg Oral Tablet TAKE 1 TABLET BY MOUTH TWICE A DAY FOR MOOD DISORDER    Levocetirizine (XYZAL) 5 mg Oral Tablet Take 1 Tablet (5 mg total) by mouth Every evening    LORazepam (ATIVAN) 0.5 mg Oral Tablet Take 1 Tablet (0.5 mg total) by mouth Three times a day    meloxicam (MOBIC) 15 mg Oral Tablet Take 1 Tablet (15 mg total) by mouth Once a day    metoprolol succinate (TOPROL-XL) 25 mg Oral Tablet Sustained Release 24 hr Take 1 Tablet (25 mg total) by mouth Once a day    metroNIDAZOLE (FLAGYL) 500 mg Oral Tablet Take 1 Tablet (500 mg total) by mouth Three times a day for 7 days    montelukast (SINGULAIR) 10 mg Oral Tablet Take 1 Tablet (10 mg total) by mouth Every evening for 90 days    omeprazole (PRILOSEC) 20 mg Oral Capsule, Delayed Release(E.C.) TAKE 1 CAPSULE BY MOUTH TWICE A DAY FOR GASTRITIS    ondansetron (ZOFRAN ODT) 4 mg Oral Tablet, Rapid Dissolve Take 1 Tablet (4 mg total) by mouth Every 8 hours as needed    sucralfate (CARAFATE) 1 gram Oral Tablet Take 1 Tablet (1 g total) by mouth Twice a day before  meals        Fu  visit  /Acute       FU  Diarrhea    Before appendix was  out   pt went   3 x per day  solid  stools  ever since  pt has been having diarrhea    no belching or flatus    pt  was having abdominal bloating   and gaining weight    Chunks or  water x   2 weeks   crampy  pain     x  2 weeks   now  x3  today  x  7   no fever or chills       Pt has been on probiotics  only         Fu GERD    Pt  is on  Carafate and Prilosec    no current  symptoms     Fu Weight gain  pt feel he can't  loose  weight and he isnot eating much  REVIEW OF SYSTEMS:   Review of Systems  General: No fever.  No chills.  No weight changes.  HEENT: No vision changes.  Cardiac: No chest pain. No palpitations.  No dizziness.  No light-headedness.  No near syncope.  Resp: No dyspnea at rest, no dyspnea on exertion; no cough or hemoptysis; no orthopnea or PND.  GI: No N/V. No melena.  No bright red blood per rectum.  Ext: No edema.  No claudication.  Neuro: No focal weakness.  No numbness.  All other ROS negative.      Objective:   BP 122/86 (Site: Left, Patient Position: Sitting)   Pulse 80   Temp 36.4 C (97.5 F) (Tympanic)   Ht 1.651 m (5\' 5" )   Wt 97.3 kg (214 lb 6.4 oz)   SpO2 96%   BMI 35.68 kg/m              PHYSICAL EXAM  Physical Exam  Gen: NAD. Alert.   HEENT: PERRL; conjunctivae clear. No JVD or carotid bruit.  Cardiac: RRR with normal S1, S2.   Lungs: Clear to auscultation bilaterally. No rales. No wheezing. No rhonchi.  Abdomen: Soft, non-tender.non-distended  nl bowel sounds    Extremities: No edema. No cyanosis. No clubbing.  Neurologic:  Grossly intact    Lab Results   Component Value Date    CHOLESTEROL 197 03/01/2022    HDLCHOL 44 03/01/2022    LDLCHOL 134 (H) 03/01/2022    TRIG 97 03/01/2022            COMPREHENSIVE METABOLIC PANEL   Lab Results   Component Value Date    SODIUM 137 03/01/2022    POTASSIUM 3.9 03/01/2022    CHLORIDE 104 03/01/2022    CO2 27 03/01/2022    ANIONGAP 6 (L) 03/01/2022     BUN 13 03/01/2022    CREATININE 0.75 03/01/2022    GLUCOSENF 89 03/01/2022    CALCIUM 9.3 03/01/2022    ALBUMIN 4.7 03/01/2022    TOTALPROTEIN 7.0 03/01/2022    ALKPHOS 72 03/01/2022    AST 21 03/01/2022    ALT 39 03/01/2022            THYROID STIMULATING HORMONE  Lab Results   Component Value Date    TSH 0.649 03/01/2022           Assessment/Plan  Problem List Items Addressed This Visit          Digestive    Diarrhea - Primary    Relevant Orders    CBC/DIFF    COMPREHENSIVE METABOLIC PNL, FASTING    THYROID STIMULATING HORMONE (SENSITIVE TSH)    LIPID PANEL    Referral to External Provider (AMB)       Musculoskeletal    Osteoarthritis    Relevant Orders    CBC/DIFF    COMPREHENSIVE METABOLIC PNL, FASTING    THYROID STIMULATING HORMONE (SENSITIVE TSH)    LIPID PANEL       Psychiatric    GAD (generalized anxiety disorder)    Relevant Orders    CBC/DIFF    COMPREHENSIVE METABOLIC PNL, FASTING    THYROID STIMULATING HORMONE (SENSITIVE TSH)    LIPID PANEL     Other Visit Diagnoses       Weight gain        Relevant Orders    CBC/DIFF    COMPREHENSIVE METABOLIC PNL, FASTING    THYROID STIMULATING HORMONE (SENSITIVE TSH)    LIPID PANEL    Abdominal bloating  Relevant Orders    CBC/DIFF    COMPREHENSIVE METABOLIC PNL, FASTING    THYROID STIMULATING HORMONE (SENSITIVE TSH)    LIPID PANEL    Referral to External Provider (AMB)             Referral to Dr Gwenlyn Found in [place] for GI eval   Stool cdiff epi and  cs      Meds reviewed as well as labs.  Chart reviewed and updated.   Continue current treatment.  Keep follow-up appointment.   Vaccine hx reviewed.   Metamucil  or  citracil   Flagyl  500 tid  x  10 days

## 2023-02-19 ENCOUNTER — Other Ambulatory Visit (INDEPENDENT_AMBULATORY_CARE_PROVIDER_SITE_OTHER): Payer: Self-pay | Admitting: Internal Medicine

## 2023-02-19 LAB — ROUTINE STOOL CULTURE (INCLUDING E. COLI SHIGA TOXIN): STOOL CULTURE: NORMAL

## 2023-02-19 LAB — E. COLI SHIGA TOXIN
SHIGA TOXIN 1: NEGATIVE
SHIGA TOXIN 2: NEGATIVE

## 2023-02-19 MED ORDER — ATORVASTATIN 20 MG TABLET
20.0000 mg | ORAL_TABLET | Freq: Every day | ORAL | 2 refills | Status: AC
Start: 2023-02-19 — End: ?

## 2023-02-20 ENCOUNTER — Encounter (INDEPENDENT_AMBULATORY_CARE_PROVIDER_SITE_OTHER): Payer: Self-pay | Admitting: Internal Medicine

## 2023-02-23 LAB — PANCREATIC ELASTASE, FECES: PANCREATIC ELASTASE-1: 167 mcg/g — ABNORMAL LOW

## 2023-02-27 ENCOUNTER — Other Ambulatory Visit (INDEPENDENT_AMBULATORY_CARE_PROVIDER_SITE_OTHER): Payer: Self-pay | Admitting: Internal Medicine

## 2023-02-27 MED ORDER — CREON 36,000 UNIT-114,000 UNIT-180,000 UNIT CAPSULE,DELAYED RELEASE
DELAYED_RELEASE_CAPSULE | ORAL | 2 refills | Status: DC
Start: 2023-02-27 — End: 2023-05-15

## 2023-03-01 ENCOUNTER — Telehealth (INDEPENDENT_AMBULATORY_CARE_PROVIDER_SITE_OTHER): Payer: Self-pay | Admitting: Internal Medicine

## 2023-03-27 ENCOUNTER — Encounter (INDEPENDENT_AMBULATORY_CARE_PROVIDER_SITE_OTHER): Payer: Self-pay | Admitting: Internal Medicine

## 2023-03-27 ENCOUNTER — Other Ambulatory Visit: Payer: Self-pay

## 2023-03-27 ENCOUNTER — Ambulatory Visit: Payer: 59 | Attending: Internal Medicine | Admitting: Internal Medicine

## 2023-03-27 VITALS — HR 99

## 2023-03-27 DIAGNOSIS — R197 Diarrhea, unspecified: Secondary | ICD-10-CM | POA: Insufficient documentation

## 2023-03-27 DIAGNOSIS — R140 Abdominal distension (gaseous): Secondary | ICD-10-CM | POA: Insufficient documentation

## 2023-03-27 NOTE — Nursing Note (Unsigned)
GI issue pt states he was dx with celiac disease and wanted to get more labs just   To make sure that's what it is

## 2023-03-28 ENCOUNTER — Encounter (INDEPENDENT_AMBULATORY_CARE_PROVIDER_SITE_OTHER): Payer: Self-pay | Admitting: Internal Medicine

## 2023-03-28 ENCOUNTER — Telehealth (INDEPENDENT_AMBULATORY_CARE_PROVIDER_SITE_OTHER): Payer: Self-pay | Admitting: Internal Medicine

## 2023-03-28 NOTE — Progress Notes (Signed)
INTERNAL MEDICINE, BUILDING A  [address]  [place] [address]  Operated by [facility]  Progress Note    Name: Andrew Collier MRN:  [id number]   Date: 03/27/2023 DOB:  1986/10/10 (37 y.o.)              Chief Complaint: Follow Up (GI issue pt states he was dx with celiac disease and wanted to get more labs just /To make sure that's what it is )       HPI: Andrew Collier is a 37 y.o. male who complains of  abdominal pain  bloating and diarrhea continues   Pt dx with EPI  and has been on creon  but lower dose   24,000 as pharmacy could not get  the  36000  Pt  has  checked with pharmacy  and they have it on order   Pt has been some better with stools decreased  since on  creon     Pt seeing GI will get report  pt  requests  testing for celiac  he does not want to  change diet if  not  a problem    This  was discussed  testing ordered      Allergies:  Allergies   Allergen Reactions    Latex  Other Adverse Reaction (Add comment)     unknown    Cephalexin  Other Adverse Reaction (Add comment)     Affect his sezure meds    Levetiracetam Mental Status Effect     Hears voices    Carbamazepine  Other Adverse Reaction (Add comment)     Unknown      Diphenhydramine  Other Adverse Reaction (Add comment)     Unknown      Morphine  Other Adverse Reaction (Add comment)     Heart rate drops       atorvastatin (LIPITOR) 20 mg Oral Tablet, Take 1 Tablet (20 mg total) by mouth Once a day  Ibuprofen (MOTRIN) 800 mg Oral Tablet, TAKE 1 TABLET BY MOUTH 3 TIMES A DAY AS NEEDED FOR PAIN  lamoTRIgine (LAMICTAL) 200 mg Oral Tablet, TAKE 1 TABLET BY MOUTH TWICE A DAY FOR MOOD DISORDER  Levocetirizine (XYZAL) 5 mg Oral Tablet, Take 1 Tablet (5 mg total) by mouth Every evening  lipase-protease-amylase (CREON) 36,000-114,000- 180,000 unit Oral Capsule, Delayed Release(E.C.), Take 2 caps po with each meal and  1 cap po with each snack  LORazepam (ATIVAN) 0.5 mg Oral Tablet, Take 1 Tablet (0.5 mg total) by mouth Three  times a day  meloxicam (MOBIC) 15 mg Oral Tablet, Take 1 Tablet (15 mg total) by mouth Once a day  metoprolol succinate (TOPROL-XL) 25 mg Oral Tablet Sustained Release 24 hr, Take 1 Tablet (25 mg total) by mouth Once a day  montelukast (SINGULAIR) 10 mg Oral Tablet, Take 1 Tablet (10 mg total) by mouth Every evening for 90 days  omeprazole (PRILOSEC) 20 mg Oral Capsule, Delayed Release(E.C.), TAKE 1 CAPSULE BY MOUTH TWICE A DAY FOR GASTRITIS  ondansetron (ZOFRAN ODT) 4 mg Oral Tablet, Rapid Dissolve, Take 1 Tablet (4 mg total) by mouth Every 8 hours as needed  sucralfate (CARAFATE) 1 gram Oral Tablet, Take 1 Tablet (1 g total) by mouth Twice a day before meals    No facility-administered medications prior to visit.       Review of Systems -   ROS  Const  Reports system reviewed and no additional complaints, except as documented  ENT  Reports system reviewed and no additional complaints, except as documented  Resp  Reports system reviewed and no additional complaints, except as documented  GI  Reports system reviewed and no additional complaints, except as documented     OBJECTIVE:  Vitals:    03/27/23 1120   Pulse: 99   SpO2: 95%      Physical Exam   Exam-AMB  Const  General: cooperative, no acute distress and alert  Abd soft tender diffuse  no rebound or guarding           ASSESSMENT:     ICD-10-CM    1. Diarrhea, unspecified type  R19.7 CELIAC SCREENING PROFILE, IGA WITH REFLEX TO IGG, SERUM      2. Abdominal bloating  R14.0 CELIAC SCREENING PROFILE, IGA WITH REFLEX TO IGG, SERUM          Orders Placed This Encounter    CELIAC SCREENING PROFILE, IGA WITH REFLEX TO IGG, SERUM        PLAN: Treatment per orders . Call or return to clinic prn if these symptoms worsen or fail to improve as anticipated.  Meds reviewed as well as labs.  Testing  was done    Need GI reports   EGD and colonoscopy coming up  but not until June pt will continue to try  to see if there is a cancellation       Samuella Cota, PA-C

## 2023-03-29 ENCOUNTER — Ambulatory Visit (INDEPENDENT_AMBULATORY_CARE_PROVIDER_SITE_OTHER): Payer: 59 | Admitting: NEUROLOGY

## 2023-03-29 NOTE — Progress Notes (Deleted)
ASSESSMENT  1.  2.    PLAN  1.  2.    Thank you for allowing me to participate in your patient's care and please do not hesitate to contact me for any questions or concerns.    Patrick North, DO  Assistant Professor of Neurology  [facility]       ==========================================================================================================================================    NAME:  Andrew Collier  DOB:  1985-12-22  VISIT DATE:  03/29/2023    CC:  ***    Patient seen in consultation at the request of Dr. Marland Kitchen  History obtained from the patient and chart/records  Age of patient:  37 y.o.      HPI:   I had the pleasure of seeing your patient in neurology clinic for an outpatient consultation, who is a 37 y.o. year old male who was referred for evaluation of ***.  Please allow me to summarize the history for the record.  ***    ============================================================================================================================================  PMHx  Patient Active Problem List   Diagnosis    Circadian rhythm sleep disorder    Epilepsy (CMS HCC)    GAD (generalized anxiety disorder)    Osteoarthritis    Paroxysmal supraventricular tachycardia (CMS HCC)    Thoracic back pain    Diarrhea     Past Surgical History:   Procedure Laterality Date    BLADDER SURGERY N/A     kidney stones  stents    pervaiz    COLONOSCOPY N/A     01/21/2020  [facility]    HX APPENDECTOMY N/A     laparoscopic    HX CHOLECYSTECTOMY      [facility]   2018    HX HIP REPLACEMENT Left     2006  Dr  Wynne Dust    [facility] SHOULDER SURGERY Left     tear   Dr Ignacia Marvel UPPER ENDOSCOPY N/A     01/21/2020  dr  Baldomero Lamy    HX VASECTOMY N/A     ORTHOPEDIC SURGERY Left     uva   left knee cartialge removedal hip 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    RENAL ARTERY STENT N/A     SEPTOPLASTY N/A     05/28/2015         Family Medical History:       Problem Relation (Age of Onset)     Arthritis-rheumatoid Mother    Elevated Lipids Mother, Father    Hypertension (High Blood Pressure) Mother            Current Outpatient Medications   Medication Sig Dispense Refill    atorvastatin (LIPITOR) 20 mg Oral Tablet Take 1 Tablet (20 mg total) by mouth Once a day 90 Tablet 2    Ibuprofen (MOTRIN) 800 mg Oral Tablet TAKE 1 TABLET BY MOUTH 3 TIMES A DAY AS NEEDED FOR PAIN 90 Tablet 2    lamoTRIgine (LAMICTAL) 200 mg Oral Tablet TAKE 1 TABLET BY MOUTH TWICE A DAY FOR MOOD DISORDER 180 Tablet 1    Levocetirizine (XYZAL) 5 mg Oral Tablet Take 1 Tablet (5 mg total) by mouth Every evening 90 Tablet 3    lipase-protease-amylase (CREON) 36,000-114,000- 180,000 unit Oral Capsule, Delayed Release(E.C.) Take 2 caps po with each meal and  1 cap po with each snack 240 Capsule 2    LORazepam (ATIVAN) 0.5 mg Oral Tablet Take 1 Tablet (0.5 mg total) by  mouth Three times a day 90 Tablet 1    meloxicam (MOBIC) 15 mg Oral Tablet Take 1 Tablet (15 mg total) by mouth Once a day 30 Tablet 0    metoprolol succinate (TOPROL-XL) 25 mg Oral Tablet Sustained Release 24 hr Take 1 Tablet (25 mg total) by mouth Once a day      montelukast (SINGULAIR) 10 mg Oral Tablet Take 1 Tablet (10 mg total) by mouth Every evening for 90 days 90 Tablet 3    omeprazole (PRILOSEC) 20 mg Oral Capsule, Delayed Release(E.C.) TAKE 1 CAPSULE BY MOUTH TWICE A DAY FOR GASTRITIS 30 Capsule 7    ondansetron (ZOFRAN ODT) 4 mg Oral Tablet, Rapid Dissolve Take 1 Tablet (4 mg total) by mouth Every 8 hours as needed 20 Tablet 0    sucralfate (CARAFATE) 1 gram Oral Tablet Take 1 Tablet (1 g total) by mouth Twice a day before meals       No current facility-administered medications for this visit.     Allergies   Allergen Reactions    Latex  Other Adverse Reaction (Add comment)     unknown    Cephalexin  Other Adverse Reaction (Add comment)     Affect his sezure meds    Levetiracetam Mental Status Effect     Hears voices    Carbamazepine  Other Adverse Reaction (Add  comment)     Unknown      Diphenhydramine  Other Adverse Reaction (Add comment)     Unknown      Morphine  Other Adverse Reaction (Add comment)     Heart rate drops     Social History     Socioeconomic History    Marital status: Married     Spouse name: Not on file    Number of children: Not on file    Years of education: Not on file    Highest education level: Not on file   Occupational History    Not on file   Tobacco Use    Smoking status: Never    Smokeless tobacco: Never   Substance and Sexual Activity    Alcohol use: Never    Drug use: Never    Sexual activity: Not on file   Other Topics Concern    Not on file   Social History Narrative    Not on file     Social Determinants of Health     Financial Resource Strain: Low Risk  (01/26/2022)    Financial Resource Strain     SDOH Financial: No   Transportation Needs: Low Risk  (01/26/2022)    Transportation Needs     SDOH Transportation: No   Social Connections: Medium Risk (01/26/2022)    Social Connections     SDOH Social Isolation: 3 to 5 times a week   Intimate Partner Violence: High Risk (01/26/2022)    Intimate Partner Violence     SDOH Domestic Violence: No   Housing Stability: Low Risk  (01/26/2022)    Housing Stability     SDOH Housing Situation: I have housing.     SDOH Housing Worry: Not on file       ============================================================================================================================================  GENERAL EXAMINATION  There were no vitals taken for this visit.      Vital signs personally reviewed  General: No acute distress, alert  HEENT: Normocephalic, no scleral icterus  Pulmonary: No accessory muscle use, no tachypnea  Cardiovascular: Heart with regular rate & rhythm  Extremities: No significant edema, No cyanosis  NEUROLOGIC EXAM  On neurological exam, patient was awake, alert and answering questions appropriately  Speech was fluent, without dysarthria or aphasia.    CN  II: not directly tested, grossly  intact  III, IV, VI: extraocular movements intact without nystagmus  V: intact to light touch  VII: face symmetric without weakness  VIII: grossly intact  IX, X: symmetric palatal elevation  XI: normal strength of trapezius and sternocleidomastoid bilaterally  XII: tongue midline with full movements    MOTOR  Bulk: normal  Tone: normal  Abnormal Movements: none  Fasciculations: none    Strength: Formal strength testing was deferred. Patient moving all extremities symmetrically and against gravity***    MRC Grading Scale   Right Left   Deltoid *** ***   Biceps *** ***   Triceps *** ***   Wrist Extension *** ***   Wrist Flexion - -   Finger Extension *** ***   Finger Abduction *** ***   Finger Flexion *** ***   Hip Flexion *** ***   Hip Extension - -   Hip Abduction - -   Hip Adduction - -   Knee Extension *** ***   Knee Flexion *** ***   Ankle Dorsiflexion *** ***   Ankle Plantarflexion *** ***   Toe Extension *** ***   Toe Flexion - -     REFLEXES   Right Left   Biceps *** ***   Triceps *** ***   Brachioradialis *** ***   Patellar *** ***   Achilles *** ***   Plantar *** ***   Hoffman *** ***   Pectoralis - -   Jaw Jerk - -       SENSORY  Light touch: ***  Pin: ***  Vibration: ***  Proprioception: ***    GAIT  General: casual, normal gait  Toe Walk: normal  Heel Walk: normal  Tandem: normal    COORDINATION  Finger nose finger: normal  Heel to shin: normal    ================================================================================================================================LABS  Personal Review of prior labs is notable for: 2024  TSH WNL  CMP largely WNL   CBC largely WNL   IMAGING  Personal Review of imaging is notable for: [facility] July 2017 - prominence of lateral ventricles      OTHER DIAGNOSTICS  Personal Review of other prior diagnostics is notable for: not applicable

## 2023-03-30 LAB — CELIAC SCREENING PROFILE, IGA WITH REFLEX TO IGG, SERUM
GLIADIN (DEAMIDATED) ANTIBODY IGA QUALITATIVE: POSITIVE — AB
GLIADIN (DEAMIDATED) ANTIBODY IGA QUANTITATIVE: 47.8 U/mL — ABNORMAL HIGH (ref <15.0)
TISSUE TRANSGLUTAMINASE ANTIBODIES IGA QUALITATIVE: POSITIVE — AB
TISSUE TRANSGLUTAMINASE ANTIBODIES IGA QUANTITATIVE: 148.8 U/mL — ABNORMAL HIGH (ref <15.0)

## 2023-05-15 ENCOUNTER — Other Ambulatory Visit (INDEPENDENT_AMBULATORY_CARE_PROVIDER_SITE_OTHER): Payer: Self-pay | Admitting: Internal Medicine

## 2023-05-15 MED ORDER — CREON 36,000 UNIT-114,000 UNIT-180,000 UNIT CAPSULE,DELAYED RELEASE
DELAYED_RELEASE_CAPSULE | ORAL | 2 refills | Status: DC
Start: 2023-05-15 — End: 2023-08-01

## 2023-05-24 ENCOUNTER — Other Ambulatory Visit (INDEPENDENT_AMBULATORY_CARE_PROVIDER_SITE_OTHER): Payer: Self-pay | Admitting: Internal Medicine

## 2023-05-28 ENCOUNTER — Other Ambulatory Visit (INDEPENDENT_AMBULATORY_CARE_PROVIDER_SITE_OTHER): Payer: Self-pay | Admitting: Internal Medicine

## 2023-07-26 ENCOUNTER — Encounter (HOSPITAL_COMMUNITY): Payer: Self-pay | Admitting: Family

## 2023-07-26 ENCOUNTER — Other Ambulatory Visit (HOSPITAL_COMMUNITY): Payer: Self-pay | Admitting: Family

## 2023-07-26 DIAGNOSIS — R140 Abdominal distension (gaseous): Secondary | ICD-10-CM

## 2023-07-29 NOTE — Progress Notes (Signed)
Pt brought to CT scanner and taken back to assigned room; pt's vitals within normal range.

## 2023-07-29 NOTE — ED Triage Notes (Signed)
Pt ambulatory to triage c/o abd pain and swelling LUQ. 8/10 pain. Pt has had appendix and gallbladder removed. Pt states no medication taken today of pain. Hx celiac disease. Pr has lost 32lb since May.

## 2023-07-29 NOTE — ED Notes (Signed)
Pt discharged with instructions and without printed prescriptions. Pt given the opportunity to ask questions. Verbalized understanding of all discharge instructions. Ambulated out of ED in NAD.

## 2023-08-01 ENCOUNTER — Ambulatory Visit: Payer: 59 | Attending: Internal Medicine | Admitting: Internal Medicine

## 2023-08-01 ENCOUNTER — Encounter (INDEPENDENT_AMBULATORY_CARE_PROVIDER_SITE_OTHER): Payer: Self-pay | Admitting: Internal Medicine

## 2023-08-01 ENCOUNTER — Other Ambulatory Visit: Payer: Self-pay

## 2023-08-01 VITALS — BP 130/90 | HR 94 | Temp 98.5°F | Ht 65.0 in | Wt 200.0 lb

## 2023-08-01 DIAGNOSIS — R140 Abdominal distension (gaseous): Secondary | ICD-10-CM | POA: Insufficient documentation

## 2023-08-01 DIAGNOSIS — R112 Nausea with vomiting, unspecified: Secondary | ICD-10-CM | POA: Insufficient documentation

## 2023-08-01 DIAGNOSIS — R1010 Upper abdominal pain, unspecified: Secondary | ICD-10-CM | POA: Insufficient documentation

## 2023-08-01 LAB — COMPREHENSIVE METABOLIC PANEL, NON-FASTING
ALBUMIN/GLOBULIN RATIO: 2 — ABNORMAL HIGH (ref 0.8–1.4)
ALBUMIN: 5 g/dL (ref 3.5–5.7)
ALKALINE PHOSPHATASE: 71 U/L (ref 34–104)
ALT (SGPT): 38 U/L (ref 7–52)
ANION GAP: 8 mmol/L (ref 4–13)
AST (SGOT): 20 U/L (ref 13–39)
BILIRUBIN TOTAL: 0.6 mg/dL (ref 0.3–1.0)
BUN/CREA RATIO: 18 (ref 6–22)
BUN: 12 mg/dL (ref 7–25)
CALCIUM, CORRECTED: 9.2 mg/dL (ref 8.9–10.8)
CALCIUM: 10 mg/dL (ref 8.6–10.3)
CHLORIDE: 106 mmol/L (ref 98–107)
CO2 TOTAL: 24 mmol/L (ref 21–31)
CREATININE: 0.68 mg/dL (ref 0.60–1.30)
ESTIMATED GFR: 124 mL/min/{1.73_m2} (ref >59)
GLOBULIN: 2.5 — ABNORMAL LOW (ref 2.9–5.4)
GLUCOSE: 88 mg/dL (ref 74–109)
OSMOLALITY, CALCULATED: 275 mOsm/kg (ref 270–290)
POTASSIUM: 3.9 mmol/L (ref 3.5–5.1)
PROTEIN TOTAL: 7.5 g/dL (ref 6.4–8.9)
SODIUM: 138 mmol/L (ref 136–145)

## 2023-08-01 LAB — CBC WITH DIFF
BASOPHIL #: 0 10*3/uL (ref 0.00–0.10)
BASOPHIL %: 0 % (ref 0–1)
EOSINOPHIL #: 0.2 10*3/uL (ref 0.00–0.50)
EOSINOPHIL %: 2 % (ref 1–8)
HCT: 52.8 % — ABNORMAL HIGH (ref 36.7–47.1)
HGB: 17.9 g/dL — ABNORMAL HIGH (ref 12.5–16.3)
LYMPHOCYTE #: 2.2 10*3/uL (ref 1.00–3.00)
LYMPHOCYTE %: 19 % (ref 16–44)
MCH: 31 pg (ref 23.8–33.4)
MCHC: 33.8 g/dL (ref 32.5–36.3)
MCV: 91.7 fL (ref 73.0–96.2)
MONOCYTE #: 0.8 10*3/uL (ref 0.30–1.00)
MONOCYTE %: 7 % (ref 5–13)
MPV: 8.3 fL (ref 7.4–11.4)
NEUTROPHIL #: 8.2 10*3/uL — ABNORMAL HIGH (ref 1.85–7.80)
NEUTROPHIL %: 71 % (ref 43–77)
PLATELETS: 349 10*3/uL (ref 140–440)
RBC: 5.76 10*6/uL — ABNORMAL HIGH (ref 4.06–5.63)
RDW: 13.4 % (ref 12.1–16.2)
WBC: 11.6 10*3/uL — ABNORMAL HIGH (ref 3.6–10.2)

## 2023-08-01 LAB — AMYLASE: AMYLASE: 28 U/L — ABNORMAL LOW (ref 29–103)

## 2023-08-01 LAB — C-REACTIVE PROTEIN(CRP),INFLAMMATION: C-REACTIVE PROTEIN (CRP): 0.8 mg/dL — ABNORMAL HIGH (ref 0.1–0.5)

## 2023-08-01 LAB — LIPASE: LIPASE: 26 U/L (ref 11–82)

## 2023-08-01 MED ORDER — TRAMADOL 50 MG TABLET
1.0000 | ORAL_TABLET | Freq: Four times a day (QID) | ORAL | 1 refills | Status: AC | PRN
Start: 2023-08-01 — End: ?

## 2023-08-01 MED ORDER — OMEPRAZOLE 40 MG CAPSULE,DELAYED RELEASE
40.0000 mg | DELAYED_RELEASE_CAPSULE | Freq: Every day | ORAL | 4 refills | Status: DC
Start: 2023-08-01 — End: 2024-02-28

## 2023-08-01 MED ORDER — METRONIDAZOLE 500 MG TABLET
500.0000 mg | ORAL_TABLET | Freq: Three times a day (TID) | ORAL | 0 refills | Status: AC
Start: 2023-08-01 — End: 2023-08-08

## 2023-08-01 MED ORDER — FAMOTIDINE 40 MG TABLET
40.0000 mg | ORAL_TABLET | Freq: Two times a day (BID) | ORAL | 2 refills | Status: DC
Start: 2023-08-01 — End: 2024-02-28

## 2023-08-01 MED ORDER — DICYCLOMINE 20 MG TABLET
20.0000 mg | ORAL_TABLET | Freq: Four times a day (QID) | ORAL | 1 refills | Status: AC
Start: 2023-08-01 — End: ?

## 2023-08-01 NOTE — Progress Notes (Signed)
INTERNAL MEDICINE, BUILDING A  [address]  [place] [address]  Operated by [facility]  Progress Note    Name: Andrew Collier MRN:  [id number]   Date: 08/01/2023 DOB:  [birth year] (37 y.o.)              Chief Complaint: ED Follow-up (Ed follow up pt states he went to [place] er stomach swelling and vomiting was told he has kidney stones and a fatty hernia told he needs to get an mri done for him to see his family dr. Rock Nephew states stomach still hurts )       HPI: DEVINDRA SHOWS is a 37 y.o. male who complains of  pain in abdomen few hs eats and swelling with with eating regular but  worse when in [place] and  than  vomited  x  3      6-7  stools per day  runny to  solid        Sweats on day  of  ER  and severe abdominal pain  + nausea     Pain in abdomen chronic but  now  getting worse.  He says the whole thing aches but  upper abdomen is the worse   Co distention in upper abdomen   Pt follows with GI office in [place]  and they are out right now.  No further vomiting  Chronic  irregular bowels      Pt has been  having problems  since  his  GB surgery   Pt has seen  GI  and  has had EGD  Colonoscopy  since  but  despite many meds and changes pt  still has chronic  pain  with now an exacerbation     ER Records reviewed      Pt has been Gluten free  since March  using creon and was told to stop by gi  due to celiac and no help in the medication            Allergies:  Allergies   Allergen Reactions    Latex  Other Adverse Reaction (Add comment)     unknown    Cephalexin  Other Adverse Reaction (Add comment)     Affect his sezure meds    Levetiracetam Mental Status Effect     Hears voices    Carbamazepine  Other Adverse Reaction (Add comment)     Unknown      Diphenhydramine  Other Adverse Reaction (Add comment)     Unknown      Morphine  Other Adverse Reaction (Add comment)     Heart rate drops       atorvastatin (LIPITOR) 20 mg Oral Tablet, Take 1 Tablet (20 mg total) by mouth Once a day  Ibuprofen  (MOTRIN) 800 mg Oral Tablet, TAKE 1 TABLET BY MOUTH 3 TIMES A DAY AS NEEDED FOR PAIN  lamoTRIgine (LAMICTAL) 200 mg Oral Tablet, TAKE 1 TABLET BY MOUTH TWICE A DAY FOR MOOD DISORDER  Levocetirizine (XYZAL) 5 mg Oral Tablet, Take 1 Tablet (5 mg total) by mouth Every evening  LORazepam (ATIVAN) 0.5 mg Oral Tablet, Take 1 Tablet (0.5 mg total) by mouth Three times a day  meloxicam (MOBIC) 15 mg Oral Tablet, Take 1 Tablet (15 mg total) by mouth Once a day  metoprolol succinate (TOPROL-XL) 25 mg Oral Tablet Sustained Release 24 hr, Take 1 Tablet (25 mg total) by mouth Once a day  montelukast (SINGULAIR) 10 mg Oral  Tablet, Take 1 Tablet (10 mg total) by mouth Every evening for 90 days  ondansetron (ZOFRAN ODT) 4 mg Oral Tablet, Rapid Dissolve, DISSOLVE ONE TABLET ON TONGUE EVERY 8 HOURS AS NEEDED  sucralfate (CARAFATE) 1 gram Oral Tablet, Take 1 Tablet (1 g total) by mouth Twice a day before meals  omeprazole (PRILOSEC) 20 mg Oral Capsule, Delayed Release(E.C.), TAKE 1 CAPSULE BY MOUTH TWICE A DAY FOR GASTRITIS  pancreatic enzyme replacement (CREON) 36,000-114,000- 180,000 unit Oral Capsule, Delayed Release(E.C.), Take 2 caps po with each meal and  1 cap po with each snack (Patient not taking: Reported on 08/01/2023)    No facility-administered medications prior to visit.       Review of Systems -   ROS  Const  Reports system reviewed and no additional complaints, except as documented  ENT  Reports system reviewed and no additional complaints, except as documented  Resp  Reports system reviewed and no additional complaints, except as documented  GI  Reports system reviewed and no additional complaints, except as documented   OBJECTIVE:  Vitals:    08/01/23 0904   BP: (!) 130/90   Pulse: 94   Temp: 36.9 C (98.5 F)   SpO2: 95%   Weight: 90.7 kg (200 lb)   Height: 1.651 m (5\' 5" )   BMI: 33.35      Physical Exam   Exam-AMB  Const  General: cooperative, healthy appearing and no acute distress  Orientation/Consciousness:  patient oriented x3  HENMT  Ears: hearing grossly normal bilaterally  Eyes  General: appearance normal, both eyes and all related structures  Conjunctivae: conjunctivae normal  Sclera: sclerae normal  EOM: EOM intact bilaterally  Neck  Neck: normal visual inspection and no lymphadenopathy  Thyroid: thyroid normal  Carotids: no bruits  Resp  Effort & Inspection: normal respiratory effort  Auscultation: clear to auscultation bilaterally, no crackles, no rales, no rhonchi and no wheezes  Cardio  Jugular venous pressure: no JVD  Rate: regular rate  Rhythm: regular rhythm  Heart Sounds: S1 normal, S2 normal, no click, no murmurs and no rubs  Bruits: no carotid bruits  GI  Inspection: Yes normal to inspection  Palpation: + BS  minimal distention  diffuse tenderness to  light palpation  Auscultation: normal bowel sounds          ASSESSMENT:     ICD-10-CM    1. Pain of upper abdomen  R10.10 CBC/DIFF     AMYLASE     LIPASE     COMPREHENSIVE METABOLIC PANEL, NON-FASTING     HEP-2 SUBSTRATE ANTINUCLEAR ANTIBODIES (ANA), SERUM     C-REACTIVE PROTEIN(CRP),INFLAMMATION      2. Abdominal distention  R14.0 CBC/DIFF     AMYLASE     LIPASE     COMPREHENSIVE METABOLIC PANEL, NON-FASTING     HEP-2 SUBSTRATE ANTINUCLEAR ANTIBODIES (ANA), SERUM     C-REACTIVE PROTEIN(CRP),INFLAMMATION      3. Nausea & vomiting  R11.2 CBC/DIFF     AMYLASE     LIPASE     COMPREHENSIVE METABOLIC PANEL, NON-FASTING     HEP-2 SUBSTRATE ANTINUCLEAR ANTIBODIES (ANA), SERUM     C-REACTIVE PROTEIN(CRP),INFLAMMATION          Orders Placed This Encounter    CBC/DIFF    AMYLASE    LIPASE    COMPREHENSIVE METABOLIC PANEL, NON-FASTING    HEP-2 SUBSTRATE ANTINUCLEAR ANTIBODIES (ANA), SERUM    C-REACTIVE PROTEIN(CRP),INFLAMMATION    CBC WITH DIFF    metroNIDAZOLE (FLAGYL)  500 mg Oral Tablet    dicyclomine (BENTYL) 20 mg Oral Tablet    famotidine (PEPCID) 40 mg Oral Tablet    omeprazole (PRILOSEC) 40 mg Oral Capsule, Delayed Release(E.C.)    traMADoL (ULTRAM) 50 mg Oral  Tablet        PLAN: Treatment per orders . Call or return to clinic prn if these symptoms worsen or fail to improve as anticipated.  Meds reviewed as well as labs.  Chart reviewed and updated.   Add bentyl 20 mg qid   for next  48 hrs than to prn    Zofran prn nv     Flagyl 500 tid     add Prilosec and Pepcid   and  ultram prn  pain   Pt has nuclear test  next week      Samuella Cota, PA-C

## 2023-08-01 NOTE — Nursing Note (Signed)
Ed follow up pt states he went to [place] er stomach swelling and vomiting was told he has kidney stones and a fatty hernia told he needs to get an mri done for him to see his family dr. Rock Nephew states stomach still hurts

## 2023-08-02 ENCOUNTER — Other Ambulatory Visit (INDEPENDENT_AMBULATORY_CARE_PROVIDER_SITE_OTHER): Payer: Self-pay | Admitting: Internal Medicine

## 2023-08-02 DIAGNOSIS — D72829 Elevated white blood cell count, unspecified: Secondary | ICD-10-CM

## 2023-08-02 DIAGNOSIS — R7989 Other specified abnormal findings of blood chemistry: Secondary | ICD-10-CM

## 2023-08-03 LAB — HEP-2 SUBSTRATE ANTINUCLEAR ANTIBODIES (ANA), SERUM: ANA INTERPRETATION: NEGATIVE

## 2023-08-09 ENCOUNTER — Ambulatory Visit: Payer: 59 | Attending: Internal Medicine

## 2023-08-09 ENCOUNTER — Other Ambulatory Visit: Payer: Self-pay

## 2023-08-09 DIAGNOSIS — R7989 Other specified abnormal findings of blood chemistry: Secondary | ICD-10-CM | POA: Insufficient documentation

## 2023-08-10 ENCOUNTER — Ambulatory Visit (HOSPITAL_COMMUNITY): Payer: Self-pay

## 2023-08-10 ENCOUNTER — Other Ambulatory Visit (HOSPITAL_COMMUNITY): Payer: 59

## 2023-08-10 LAB — CBC WITH DIFF
BASOPHIL #: 0.1 10*3/uL (ref 0.00–0.10)
BASOPHIL %: 1 % (ref 0–1)
EOSINOPHIL #: 0.3 10*3/uL (ref 0.00–0.50)
EOSINOPHIL %: 2 % (ref 1–8)
HCT: 51.6 % — ABNORMAL HIGH (ref 36.7–47.1)
HGB: 17.4 g/dL — ABNORMAL HIGH (ref 12.5–16.3)
LYMPHOCYTE #: 2.4 10*3/uL (ref 1.00–3.00)
LYMPHOCYTE %: 17 % (ref 16–44)
MCH: 30.6 pg (ref 23.8–33.4)
MCHC: 33.7 g/dL (ref 32.5–36.3)
MCV: 90.8 fL (ref 73.0–96.2)
MONOCYTE #: 1.2 10*3/uL — ABNORMAL HIGH (ref 0.30–1.00)
MONOCYTE %: 9 % (ref 5–13)
MPV: 8 fL (ref 7.4–11.4)
NEUTROPHIL #: 10.4 10*3/uL — ABNORMAL HIGH (ref 1.85–7.80)
NEUTROPHIL %: 72 % (ref 43–77)
PLATELETS: 389 10*3/uL (ref 140–440)
RBC: 5.69 10*6/uL — ABNORMAL HIGH (ref 4.06–5.63)
RDW: 13.5 % (ref 12.1–16.2)
WBC: 14.4 10*3/uL — ABNORMAL HIGH (ref 3.6–10.2)

## 2023-08-13 ENCOUNTER — Ambulatory Visit
Admission: RE | Admit: 2023-08-13 | Discharge: 2023-08-13 | Disposition: A | Discharge status: Home / Self Care | Payer: 59 | Source: Ambulatory Visit | Attending: Family | Admitting: Family

## 2023-08-13 ENCOUNTER — Inpatient Hospital Stay (HOSPITAL_BASED_OUTPATIENT_CLINIC_OR_DEPARTMENT_OTHER)
Admission: RE | Admit: 2023-08-13 | Discharge: 2023-08-13 | Disposition: A | Discharge status: Home / Self Care | Payer: 59 | Source: Ambulatory Visit

## 2023-08-13 ENCOUNTER — Other Ambulatory Visit: Payer: Self-pay

## 2023-08-13 ENCOUNTER — Other Ambulatory Visit (HOSPITAL_COMMUNITY): Payer: Self-pay | Admitting: Family

## 2023-08-13 ENCOUNTER — Other Ambulatory Visit (INDEPENDENT_AMBULATORY_CARE_PROVIDER_SITE_OTHER): Payer: Self-pay | Admitting: Internal Medicine

## 2023-08-13 DIAGNOSIS — R109 Unspecified abdominal pain: Secondary | ICD-10-CM | POA: Insufficient documentation

## 2023-08-13 DIAGNOSIS — R7989 Other specified abnormal findings of blood chemistry: Secondary | ICD-10-CM

## 2023-08-13 DIAGNOSIS — D72829 Elevated white blood cell count, unspecified: Secondary | ICD-10-CM

## 2023-08-13 MED ORDER — CIPROFLOXACIN 500 MG TABLET
500.0000 mg | ORAL_TABLET | Freq: Two times a day (BID) | ORAL | 0 refills | Status: AC
Start: 2023-08-13 — End: 2023-08-23

## 2023-08-13 MED ORDER — GADOBUTROL 10 MMOL/10 ML (1 MMOL/ML) INTRAVENOUS SOLUTION
10.0000 mL | INTRAVENOUS | Status: AC
Start: 2023-08-13 — End: 2023-08-13
  Administered 2023-08-13: 10 mL via INTRAVENOUS

## 2023-08-15 ENCOUNTER — Inpatient Hospital Stay
Admission: RE | Admit: 2023-08-15 | Discharge: 2023-08-15 | Disposition: A | Discharge status: Home / Self Care | Payer: 59 | Source: Ambulatory Visit | Attending: Family | Admitting: Family

## 2023-08-15 ENCOUNTER — Inpatient Hospital Stay (HOSPITAL_COMMUNITY)
Admission: RE | Admit: 2023-08-15 | Discharge: 2023-08-15 | Disposition: A | Discharge status: Home / Self Care | Payer: 59 | Source: Ambulatory Visit | Attending: Family

## 2023-08-15 ENCOUNTER — Other Ambulatory Visit: Payer: Self-pay

## 2023-08-15 DIAGNOSIS — R140 Abdominal distension (gaseous): Secondary | ICD-10-CM | POA: Insufficient documentation

## 2023-08-20 ENCOUNTER — Encounter (INDEPENDENT_AMBULATORY_CARE_PROVIDER_SITE_OTHER): Payer: Self-pay | Admitting: Internal Medicine

## 2023-08-20 ENCOUNTER — Other Ambulatory Visit: Payer: Self-pay

## 2023-08-20 ENCOUNTER — Ambulatory Visit: Payer: 59 | Attending: Internal Medicine | Admitting: Internal Medicine

## 2023-08-20 ENCOUNTER — Ambulatory Visit (INDEPENDENT_AMBULATORY_CARE_PROVIDER_SITE_OTHER): Payer: Self-pay | Admitting: Internal Medicine

## 2023-08-20 DIAGNOSIS — R1084 Generalized abdominal pain: Secondary | ICD-10-CM | POA: Insufficient documentation

## 2023-08-20 DIAGNOSIS — R140 Abdominal distension (gaseous): Secondary | ICD-10-CM | POA: Insufficient documentation

## 2023-08-20 DIAGNOSIS — R197 Diarrhea, unspecified: Secondary | ICD-10-CM | POA: Insufficient documentation

## 2023-08-20 MED ORDER — GABAPENTIN 600 MG TABLET
600.0000 mg | ORAL_TABLET | Freq: Three times a day (TID) | ORAL | 1 refills | Status: DC
Start: 2023-08-20 — End: 2024-02-28

## 2023-08-20 MED ORDER — RIFAXIMIN 550 MG TABLET
550.0000 mg | ORAL_TABLET | Freq: Three times a day (TID) | ORAL | 0 refills | Status: AC
Start: 2023-08-20 — End: 2023-09-03

## 2023-08-25 ENCOUNTER — Encounter (INDEPENDENT_AMBULATORY_CARE_PROVIDER_SITE_OTHER): Payer: Self-pay | Admitting: Internal Medicine

## 2023-08-27 ENCOUNTER — Encounter (INDEPENDENT_AMBULATORY_CARE_PROVIDER_SITE_OTHER): Payer: Self-pay

## 2023-08-27 ENCOUNTER — Ambulatory Visit (INDEPENDENT_AMBULATORY_CARE_PROVIDER_SITE_OTHER): Payer: Self-pay

## 2023-08-27 NOTE — Nursing Note (Signed)
Received GI referral/notes from Otelia Santee, NP. Placed in media.     Diagnosis: Abdominal pain    See epic for notes also      MyChart message sent    Schedule with general GI    Ernestina Columbia, BSN  Referral Coordinator Gastroenterology Clinic, [facility]

## 2023-10-01 ENCOUNTER — Telehealth (INDEPENDENT_AMBULATORY_CARE_PROVIDER_SITE_OTHER): Payer: Self-pay | Admitting: Internal Medicine

## 2023-10-01 ENCOUNTER — Other Ambulatory Visit: Payer: Self-pay

## 2023-10-01 ENCOUNTER — Ambulatory Visit: Payer: 59 | Attending: Internal Medicine | Admitting: Internal Medicine

## 2023-10-01 VITALS — HR 99 | Temp 98.3°F

## 2023-10-01 DIAGNOSIS — R531 Weakness: Secondary | ICD-10-CM | POA: Insufficient documentation

## 2023-10-01 DIAGNOSIS — R197 Diarrhea, unspecified: Secondary | ICD-10-CM | POA: Insufficient documentation

## 2023-10-01 DIAGNOSIS — Z20818 Contact with and (suspected) exposure to other bacterial communicable diseases: Secondary | ICD-10-CM | POA: Insufficient documentation

## 2023-10-01 MED ORDER — VIBERZI 100 MG TABLET
100.0000 mg | ORAL_TABLET | Freq: Two times a day (BID) | ORAL | 0 refills | Status: DC
Start: 2023-10-01 — End: 2023-11-07

## 2023-10-01 NOTE — Nursing Note (Signed)
10/01/23 1500   Required: Location Test Performed At:   PRN - [facility], [address], [place], [place] [address]   Rapid Flu   Time Performed 1510   Rapid Flu A Result Negative   Rapid Flu B Result Negative   Initials tp   Rapid Strep   Time Performed 1510   Rapid Strep Negative   Nurse initials tp   Ordering Physician km

## 2023-10-01 NOTE — Progress Notes (Signed)
INTERNAL MEDICINE, BUILDING A  [address]  [place] [address]  Operated by [facility]  Progress Note    Name: Andrew Collier MRN:  [id number]   Date: 10/01/2023 DOB:  November 13, 1986 (37 y.o.)              Chief Complaint: Diarrhea (Started this am  daughter test today for strep positive)       HPI: Andrew Collier is a 37 y.o. male who complains of    Stomach aches   no  fever chills  no cough congestion    Dtr tested positive today      Diarrhea   to see WF   2 weeks  now  but still    having worse  today     Pt had  clean out  with Miralax  and  Gatorade   less cramping and less swelling  light  brown in color    He  was given  linzess and he is still having as many stools     We discussed  trying viberzi  if he failed  xifaxin   and he did fail that    He  is to let  EF  know        Allergies:  Allergies   Allergen Reactions    Latex  Other Adverse Reaction (Add comment)     unknown    Cephalexin  Other Adverse Reaction (Add comment)     Affect his sezure meds    Levetiracetam Mental Status Effect     Hears voices    Carbamazepine  Other Adverse Reaction (Add comment)     Unknown      Diphenhydramine  Other Adverse Reaction (Add comment)     Unknown      Morphine  Other Adverse Reaction (Add comment)     Heart rate drops       atorvastatin (LIPITOR) 20 mg Oral Tablet, Take 1 Tablet (20 mg total) by mouth Once a day  dicyclomine (BENTYL) 20 mg Oral Tablet, Take 1 Tablet (20 mg total) by mouth Four times a day  famotidine (PEPCID) 40 mg Oral Tablet, Take 1 Tablet (40 mg total) by mouth Twice daily  gabapentin (NEURONTIN) 600 mg Oral Tablet, Take 1 Tablet (600 mg total) by mouth Three times a day  Ibuprofen (MOTRIN) 800 mg Oral Tablet, TAKE 1 TABLET BY MOUTH 3 TIMES A DAY AS NEEDED FOR PAIN  lamoTRIgine (LAMICTAL) 200 mg Oral Tablet, TAKE 1 TABLET BY MOUTH TWICE A DAY FOR MOOD DISORDER  Levocetirizine (XYZAL) 5 mg Oral Tablet, Take 1 Tablet (5 mg total) by mouth Every evening  linaCLOtide  (LINZESS) 72 mcg Oral Capsule, Take 1 Capsule (72 mcg total) by mouth  LORazepam (ATIVAN) 0.5 mg Oral Tablet, Take 1 Tablet (0.5 mg total) by mouth Three times a day  meloxicam (MOBIC) 15 mg Oral Tablet, Take 1 Tablet (15 mg total) by mouth Once a day  metoprolol succinate (TOPROL-XL) 25 mg Oral Tablet Sustained Release 24 hr, Take 1 Tablet (25 mg total) by mouth Once a day  montelukast (SINGULAIR) 10 mg Oral Tablet, Take 1 Tablet (10 mg total) by mouth Every evening for 90 days  omeprazole (PRILOSEC) 40 mg Oral Capsule, Delayed Release(E.C.), Take 1 Capsule (40 mg total) by mouth Once a day  ondansetron (ZOFRAN ODT) 4 mg Oral Tablet, Rapid Dissolve, DISSOLVE ONE TABLET ON TONGUE EVERY 8 HOURS AS NEEDED  sucralfate (CARAFATE) 1 gram Oral Tablet,  Take 1 Tablet (1 g total) by mouth Twice a day before meals  traMADoL (ULTRAM) 50 mg Oral Tablet, Take 1 Tablet (50 mg total) by mouth Every 6 hours as needed for Pain Indications: pain, acute abdominal pain    No facility-administered medications prior to visit.       Review of Systems -     OBJECTIVE:  Vitals:    10/01/23 1508   Pulse: 99   Temp: 36.8 C (98.3 F)   SpO2: 96%      Physical Exam   Exam-AMB  Const  General: cooperative, no acute distress and alert  EYE: PERRL  Nl Conjunctiva  Sclera nonicteric   HENMT  Head: normal to inspection  General nose exam: external nose normal  Neck  Neck: normal visual inspection  Lymphatic: no lymphadenopathy noted  Resp  Auscultation: clear without RRW    Abd soft not tender as he was   nl BS      Rapid Flu Results:   Rapid Flu A Result: Negative  Rapid Flu B Result: Negative     Rapid Strep Results:   Negative     COVID Results   Negative           ASSESSMENT:     ICD-10-CM    1. Diarrhea, unspecified type  R19.7 POCT Rapid Strep     POCT Rapid Flu     POCT Rapid Covid Keenan Bachelor)      2. Weakness  R53.1 POCT Rapid Strep     POCT Rapid Flu     POCT Rapid Covid Keenan Bachelor)      3. Streptococcus exposure  Z20.818           Orders Placed  This Encounter    POCT Rapid Strep    POCT Rapid Flu    POCT Rapid Covid (Sofia)    eluxadoline (VIBERZI) 100 mg Oral Tablet        PLAN: Treatment per orders . Call or return to clinic prn if these symptoms worsen or fail to improve as anticipated.  Meds reviewed as well as labs.  Chart reviewed and updated.   Viberzi  trial    100 mg bid        Continue  with WF  GI   consults notes reviewed  No antibiotics  needed       Samuella Cota, PA-C

## 2023-10-07 ENCOUNTER — Encounter (INDEPENDENT_AMBULATORY_CARE_PROVIDER_SITE_OTHER): Payer: Self-pay | Admitting: Internal Medicine

## 2023-10-07 DIAGNOSIS — Z20818 Contact with and (suspected) exposure to other bacterial communicable diseases: Secondary | ICD-10-CM | POA: Insufficient documentation

## 2023-10-22 ENCOUNTER — Telehealth (INDEPENDENT_AMBULATORY_CARE_PROVIDER_SITE_OTHER): Payer: Self-pay | Admitting: Internal Medicine

## 2023-10-22 MED ORDER — NYSTATIN 100,000 UNIT/ML ORAL SUSPENSION
5.0000 mL | Freq: Four times a day (QID) | ORAL | 1 refills | Status: AC
Start: 2023-10-22 — End: 2023-11-03

## 2023-11-05 ENCOUNTER — Other Ambulatory Visit (INDEPENDENT_AMBULATORY_CARE_PROVIDER_SITE_OTHER): Payer: Self-pay | Admitting: Internal Medicine

## 2023-11-07 ENCOUNTER — Telehealth (INDEPENDENT_AMBULATORY_CARE_PROVIDER_SITE_OTHER): Payer: Self-pay | Admitting: Internal Medicine

## 2023-11-07 ENCOUNTER — Ambulatory Visit: Payer: 59 | Attending: Internal Medicine | Admitting: Internal Medicine

## 2023-11-07 ENCOUNTER — Other Ambulatory Visit: Payer: Self-pay

## 2023-11-07 DIAGNOSIS — R197 Diarrhea, unspecified: Secondary | ICD-10-CM

## 2023-11-07 DIAGNOSIS — Z20818 Contact with and (suspected) exposure to other bacterial communicable diseases: Secondary | ICD-10-CM

## 2023-11-07 DIAGNOSIS — J029 Acute pharyngitis, unspecified: Secondary | ICD-10-CM

## 2023-11-07 MED ORDER — AMOXICILLIN 875 MG-POTASSIUM CLAVULANATE 125 MG TABLET
1.0000 | ORAL_TABLET | Freq: Two times a day (BID) | ORAL | 0 refills | Status: AC
Start: 2023-11-07 — End: 2023-11-17

## 2023-11-07 MED ORDER — MUPIROCIN 2 % TOPICAL OINTMENT
TOPICAL_OINTMENT | Freq: Three times a day (TID) | CUTANEOUS | 0 refills | Status: DC
Start: 2023-11-07 — End: 2024-06-24

## 2023-11-07 NOTE — Progress Notes (Signed)
INTERNAL MEDICINE, BUILDING A  [address]  [place] [address]  Operated by [facility]  Progress Note    Name: Andrew Collier MRN:  [id number]   Date: 11/07/2023 DOB:  [birth year] (37 y.o.)              Chief Complaint: Congestion (Co congestion /Fu diarrhea  )   Telemed          HPI: Andrew Collier is a 37 y.o. male who complains of  nasal drainage   2 weeks or longer exposed to strep   Viberzi is helping  constipated x  5days than  soft stools  off linzess  for weeks and better BM's  No nvd    Fu WF   is in Feb     Allergies:  Allergies   Allergen Reactions    Latex  Other Adverse Reaction (Add comment)     unknown    Cephalexin  Other Adverse Reaction (Add comment)     Affect his sezure meds    Levetiracetam Mental Status Effect     Hears voices    Carbamazepine  Other Adverse Reaction (Add comment)     Unknown      Diphenhydramine  Other Adverse Reaction (Add comment)     Unknown      Morphine  Other Adverse Reaction (Add comment)     Heart rate drops       atorvastatin (LIPITOR) 20 mg Oral Tablet, Take 1 Tablet (20 mg total) by mouth Once a day  dicyclomine (BENTYL) 20 mg Oral Tablet, Take 1 Tablet (20 mg total) by mouth Four times a day  famotidine (PEPCID) 40 mg Oral Tablet, Take 1 Tablet (40 mg total) by mouth Twice daily  gabapentin (NEURONTIN) 600 mg Oral Tablet, Take 1 Tablet (600 mg total) by mouth Three times a day  Ibuprofen (MOTRIN) 800 mg Oral Tablet, TAKE 1 TABLET BY MOUTH 3 TIMES A DAY AS NEEDED FOR PAIN  lamoTRIgine (LAMICTAL) 200 mg Oral Tablet, TAKE 1 TABLET BY MOUTH TWICE A DAY FOR MOOD DISORDER  Levocetirizine (XYZAL) 5 mg Oral Tablet, Take 1 Tablet (5 mg total) by mouth Every evening  LORazepam (ATIVAN) 0.5 mg Oral Tablet, Take 1 Tablet (0.5 mg total) by mouth Three times a day  meloxicam (MOBIC) 15 mg Oral Tablet, Take 1 Tablet (15 mg total) by mouth Once a day  metoprolol succinate (TOPROL-XL) 25 mg Oral Tablet Sustained Release 24 hr, Take 1 Tablet (25 mg total)  by mouth Once a day  montelukast (SINGULAIR) 10 mg Oral Tablet, Take 1 Tablet (10 mg total) by mouth Every evening for 90 days  omeprazole (PRILOSEC) 40 mg Oral Capsule, Delayed Release(E.C.), Take 1 Capsule (40 mg total) by mouth Once a day  ondansetron (ZOFRAN ODT) 4 mg Oral Tablet, Rapid Dissolve, DISSOLVE ONE TABLET ON TONGUE EVERY 8 HOURS AS NEEDED  sucralfate (CARAFATE) 1 gram Oral Tablet, Take 1 Tablet (1 g total) by mouth Twice a day before meals  traMADoL (ULTRAM) 50 mg Oral Tablet, Take 1 Tablet (50 mg total) by mouth Every 6 hours as needed for Pain Indications: pain, acute abdominal pain  eluxadoline (VIBERZI) 100 mg Oral Tablet, Take 1 Tablet (100 mg total) by mouth Twice daily  linaCLOtide (LINZESS) 72 mcg Oral Capsule, Take 1 Capsule (72 mcg total) by mouth    No facility-administered medications prior to visit.       Review of Systems -     OBJECTIVE:  There were no vitals filed for this visit.   Physical Exam   Exam  Const  General: cooperative, no acute distress and alert  Resp  Effort & Inspection: able to speak in complete sentences  Psych  Mental Status: mental status grossly normal  Mood: congruent mood  Affect: normal affect  Attitude: cooperative  Insight: insight good  Judgment: judgment good          ASSESSMENT:     ICD-10-CM    1. Sorethroat  J02.9       2. Strep throat exposure  Z20.818       3. Diarrhea, unspecified type  R19.7           Orders Placed This Encounter    amoxicillin-pot clavulanate (AUGMENTIN) 875-125 mg Oral Tablet    mupirocin (BACTROBAN) 2 % Ointment        PLAN: Treatment per orders . Call or return to clinic prn if these symptoms worsen or fail to improve as anticipated.  Meds reviewed as well as labs.  Chart reviewed and updated.   Pt feels viberzi is helping  will continue current tx  Augmentin  875mg   bid  10 days   bactroban sent in   Time on phone  with  pt    6 min    I personally offered the service to the patient, and obtained verbal consent to provide this  service.    Samuella Cota, PA-C       Samuella Cota, PA-C

## 2023-11-23 ENCOUNTER — Encounter (INDEPENDENT_AMBULATORY_CARE_PROVIDER_SITE_OTHER): Payer: Self-pay | Admitting: Internal Medicine

## 2023-12-22 ENCOUNTER — Emergency Department (HOSPITAL_BASED_OUTPATIENT_CLINIC_OR_DEPARTMENT_OTHER): Payer: 59

## 2023-12-22 ENCOUNTER — Emergency Department
Admission: EM | Admit: 2023-12-22 | Discharge: 2023-12-22 | Disposition: A | Discharge status: Home / Self Care | Payer: 59 | Attending: Family | Admitting: Family

## 2023-12-22 ENCOUNTER — Encounter (HOSPITAL_BASED_OUTPATIENT_CLINIC_OR_DEPARTMENT_OTHER): Payer: Self-pay

## 2023-12-22 ENCOUNTER — Other Ambulatory Visit: Payer: Self-pay

## 2023-12-22 DIAGNOSIS — W009XXA Unspecified fall due to ice and snow, initial encounter: Secondary | ICD-10-CM

## 2023-12-22 DIAGNOSIS — S7002XA Contusion of left hip, initial encounter: Secondary | ICD-10-CM | POA: Insufficient documentation

## 2023-12-22 DIAGNOSIS — W001XXA Fall from stairs and steps due to ice and snow, initial encounter: Secondary | ICD-10-CM | POA: Insufficient documentation

## 2023-12-22 MED ORDER — KETOROLAC 10 MG TABLET
10.0000 mg | ORAL_TABLET | Freq: Four times a day (QID) | ORAL | 0 refills | Status: DC | PRN
Start: 2023-12-22 — End: 2023-12-22

## 2023-12-22 MED ORDER — BACLOFEN 10 MG TABLET
10.0000 mg | ORAL_TABLET | Freq: Two times a day (BID) | ORAL | 0 refills | Status: AC
Start: 2023-12-22 — End: 2023-12-29

## 2023-12-22 MED ORDER — KETOROLAC 10 MG TABLET
10.0000 mg | ORAL_TABLET | Freq: Four times a day (QID) | ORAL | 0 refills | Status: AC | PRN
Start: 2023-12-22 — End: ?

## 2023-12-22 MED ORDER — METHOCARBAMOL 750 MG TABLET
1500.0000 mg | ORAL_TABLET | Freq: Four times a day (QID) | ORAL | Status: DC
Start: 2023-12-22 — End: 2023-12-22
  Administered 2023-12-22: 1500 mg via ORAL

## 2023-12-22 MED ORDER — KETOROLAC 60 MG/2 ML INTRAMUSCULAR SOLUTION
60.0000 mg | INTRAMUSCULAR | Status: AC
Start: 2023-12-22 — End: 2023-12-22
  Administered 2023-12-22: 60 mg via INTRAMUSCULAR

## 2023-12-22 MED ORDER — KETOROLAC 60 MG/2 ML INTRAMUSCULAR SOLUTION
INTRAMUSCULAR | Status: AC
Start: 2023-12-22 — End: 2023-12-22
  Filled 2023-12-22: qty 2

## 2023-12-22 MED ORDER — BACLOFEN 10 MG TABLET
10.0000 mg | ORAL_TABLET | Freq: Two times a day (BID) | ORAL | 0 refills | Status: DC
Start: 2023-12-22 — End: 2023-12-22

## 2023-12-22 MED ORDER — METHOCARBAMOL 750 MG TABLET
ORAL_TABLET | ORAL | Status: AC
Start: 2023-12-22 — End: 2023-12-22
  Filled 2023-12-22: qty 2

## 2023-12-22 NOTE — ED Nurses Note (Signed)
Patient discharged home with family.  AVS reviewed with patient/care giver.  A written copy of the AVS and discharge instructions was given to the patient/care giver. Scripts handed to patient/care giver. Questions sufficiently answered as needed.  Patient/care giver encouraged to follow up with PCP as indicated.  In the event of an emergency, patient/care giver instructed to call 911 or go to the nearest emergency room.

## 2023-12-22 NOTE — ED Nurses Note (Signed)
Patient stated that he was walking down the stairs and fell, patient denies hitting head. Patient states that he fell on the leg and hip he has a rod and pins in, and states that after falling he had immediate  numbness and tingling sensation to that leg and hip. Denies pain, patient states he took 800mg  ibuprofen around 1700

## 2023-12-22 NOTE — ED Provider Notes (Signed)
[name] - Emergency Department  ED Primary Provider Note  History of Present Illness   Chief Complaint   Patient presents with    Fall     Patient slipped and fell down steps, injuring left hip and knee. Reports some tingling to left leg. Unable to bear weight. Denies any head/neck pain.      Arrival: The patient arrived by Car  Andrew Collier is a 38 y.o. male who had concerns including Fall.  Pt states he fell on ice pain in left hip concerned due to hx of a surg on hip. Denies loc no striking head  Review of Systems   Constitutional: No fever, chills or weakness   Skin: No rash or diaphoresis  HENT: No headaches, or congestion  Eyes: No vision changes or photophobia   Cardio: No chest pain, palpitations or leg swelling   Respiratory: No cough, wheezing or SOB  GI:  No nausea, vomiting or stool changes  GU:  No dysuria, hematuria, or increased frequency  MSK: No muscle aches, +joint pain  Neuro: No seizures, LOC, numbness, tingling, or focal weakness  Psychiatric: No depression, SI or substance abuse  All other systems reviewed and are negative.    History Reviewed This Encounter: all noted and reviewed.     Physical Exam   ED Triage Vitals [12/22/23 1841]   BP (Non-Invasive) (!) 145/101   Heart Rate 99   Respiratory Rate 20   Temperature 36.8 C (98.3 F)   SpO2 96 %   Weight 91.2 kg (201 lb)   Height 1.651 m (5\' 5" )       Constitutional:  38 y.o. male who appears in no distress. Normal color, no cyanosis.   HENT:   Head: Normocephalic and atraumatic.   Mouth/Throat: Oropharynx is clear and moist.   Eyes: EOMI, PERRL   Neck: Trachea midline. Neck supple.  Cardiovascular: RRR, No murmurs, rubs or gallops. Intact distal pulses.  Pulmonary/Chest: BS equal bilaterally. No respiratory distress. No wheezes, rales or chest tenderness.   Abdominal: Bowel sounds present and normal. Abdomen soft, no tenderness, no rebound and no guarding.  Back: No midline spinal tenderness, no  paraspinal tenderness, no CVA tenderness.           Musculoskeletal: No edema, + left lateral hip tenderness  no deformity. No shortening no rotation.   Skin: warm and dry. No rash, erythema, pallor or cyanosis  Psychiatric: normal mood and affect. Behavior is normal.   Neurological: Patient keenly alert and responsive, easily able to raise eyebrows, facial muscles/expressions symmetric, speaking in fluent sentences, moving all extremities equally and fully, normal gait  Patient Data   Labs Ordered/Reviewed - No data to display  XR HIP LEFT W PELVIS 2-3 VIEWS   Final Result by Edi, Radresults In (01/25 1954)   NO ACUTE FRACTURE OR DISLOCATION.       If acute hip fracture is suspected after a fall or minor trauma and initial radiographs are negative then MRI of the pelvis and affected hip without IV contrast or CT of the pelvis and hips without IV contrast is usually appropriate as the next imaging study. (ACR Appropriateness Criteria: Acute Hip Pain-Suspected Fracture, 2018)                Radiologist location ID: [id number]           Medical Decision Making   Diff dx of hip contusion , hip fx. Dislocation. Xrays neg for fx or dislocation.  Pt had moderate pain relief .     Medications Administered in the ED   methocarbamol (ROBAXIN) tablet (1,500 mg Oral Given 12/22/23 1932)   ketorolac (TORADOL) 60mg /2 mL IM injection (60 mg IntraMUSCULAR Given 12/22/23 1933)     Clinical Impression   Contusion of left hip, initial encounter (Primary)   Fall on ice       Disposition: Discharged

## 2023-12-24 ENCOUNTER — Encounter (INDEPENDENT_AMBULATORY_CARE_PROVIDER_SITE_OTHER): Payer: Self-pay | Admitting: Internal Medicine

## 2023-12-24 ENCOUNTER — Telehealth (INDEPENDENT_AMBULATORY_CARE_PROVIDER_SITE_OTHER): Payer: Self-pay | Admitting: Internal Medicine

## 2023-12-24 ENCOUNTER — Other Ambulatory Visit: Payer: Self-pay

## 2023-12-24 ENCOUNTER — Ambulatory Visit: Payer: 59 | Attending: Internal Medicine | Admitting: Internal Medicine

## 2023-12-24 VITALS — BP 120/80 | HR 90

## 2023-12-24 DIAGNOSIS — R200 Anesthesia of skin: Secondary | ICD-10-CM | POA: Insufficient documentation

## 2023-12-24 DIAGNOSIS — M25252 Flail joint, left hip: Secondary | ICD-10-CM | POA: Insufficient documentation

## 2023-12-24 DIAGNOSIS — W19XXXA Unspecified fall, initial encounter: Secondary | ICD-10-CM

## 2023-12-24 DIAGNOSIS — M25552 Pain in left hip: Secondary | ICD-10-CM | POA: Insufficient documentation

## 2023-12-24 DIAGNOSIS — R202 Paresthesia of skin: Secondary | ICD-10-CM | POA: Insufficient documentation

## 2023-12-24 MED ORDER — METHYLPREDNISOLONE ACETATE 80 MG/ML SUSPENSION FOR INJECTION
80.0000 mg | INTRAMUSCULAR | Status: AC
Start: 2023-12-24 — End: 2023-12-24
  Administered 2023-12-24: 80 mg via INTRAMUSCULAR

## 2023-12-24 MED ORDER — PREDNISONE 20 MG TABLET
20.0000 mg | ORAL_TABLET | Freq: Two times a day (BID) | ORAL | 0 refills | Status: AC
Start: 2023-12-24 — End: 2023-12-31

## 2023-12-24 NOTE — Progress Notes (Signed)
INTERNAL MEDICINE, BUILDING A  [address]  [place] [address]  Operated by [facility]  History and Physical    Name: Andrew Collier MRN:  [id number]   Date: 12/24/2023 DOB:  Apr 24, 1986 (38 y.o.)         Name: Andrew Collier                       Date of Birth: 07-25-1986   MRN:  [id number]                         Date of visit: 12/24/2023     PCP: Samuella Cota, PA-C     Subjective  Andrew Collier is a 38 y.o. year old male who presents for ED Follow-up Larey Seat  on the left side     leg and foot going numb)   to clinic.  No specialty comments available.   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    Epilepsy (CMS HCC) 01/26/2022    GAD (generalized anxiety disorder) 01/26/2022    Osteoarthritis 01/26/2022    Paroxysmal supraventricular tachycardia (CMS HCC) 01/26/2022    Thoracic back pain 01/26/2022    Diarrhea 01/26/2022      Current Outpatient Medications   Medication Sig    atorvastatin (LIPITOR) 20 mg Oral Tablet Take 1 Tablet (20 mg total) by mouth Once a day    baclofen (LIORESAL) 10 mg Oral Tablet Take 1 Tablet (10 mg total) by mouth Twice daily for 7 days    dicyclomine (BENTYL) 20 mg Oral Tablet Take 1 Tablet (20 mg total) by mouth Four times a day    eluxadoline (VIBERZI) 100 mg Oral Tablet TAKE ONE TABLET BY MOUTH TWICE A DAY    famotidine (PEPCID) 40 mg Oral Tablet Take 1 Tablet (40 mg total) by mouth Twice daily    gabapentin (NEURONTIN) 600 mg Oral Tablet Take 1 Tablet (600 mg total) by mouth Three times a day    Ibuprofen (MOTRIN) 800 mg Oral Tablet TAKE 1 TABLET BY MOUTH 3 TIMES A DAY AS NEEDED FOR PAIN    ketorolac tromethamine (TORADOL) 10 mg Oral Tablet Take 1 Tablet (10 mg total) by mouth Every 6 hours as needed for Pain    lamoTRIgine (LAMICTAL) 200 mg Oral Tablet TAKE 1 TABLET BY MOUTH TWICE A DAY FOR MOOD DISORDER    Levocetirizine (XYZAL) 5 mg Oral Tablet Take 1 Tablet  (5 mg total) by mouth Every evening    LORazepam (ATIVAN) 0.5 mg Oral Tablet Take 1 Tablet (0.5 mg total) by mouth Three times a day    meloxicam (MOBIC) 15 mg Oral Tablet Take 1 Tablet (15 mg total) by mouth Once a day    metoprolol succinate (TOPROL-XL) 25 mg Oral Tablet Sustained Release 24 hr Take 1 Tablet (25 mg total) by mouth Once a day    montelukast (SINGULAIR) 10 mg Oral Tablet Take 1 Tablet (10 mg total) by mouth Every evening for 90 days    mupirocin (BACTROBAN) 2 % Ointment Apply topically Three times a day    omeprazole (PRILOSEC) 40 mg Oral Capsule, Delayed Release(E.C.) Take 1 Capsule (40 mg total) by mouth Once a day    ondansetron (ZOFRAN ODT) 4 mg Oral Tablet, Rapid Dissolve DISSOLVE ONE TABLET ON TONGUE EVERY 8 HOURS AS NEEDED  sucralfate (CARAFATE) 1 gram Oral Tablet Take 1 Tablet (1 g total) by mouth Twice a day before meals    traMADoL (ULTRAM) 50 mg Oral Tablet Take 1 Tablet (50 mg total) by mouth Every 6 hours as needed for Pain Indications: pain, acute abdominal pain              ED Fu    Saturday  going down outside steps  and pt  fell down last step leg gave out and  went down on left  side and  pain in left    co  on off  numbess  tingling     4-5  x   per day  about a few minutes      sitting  worse  position   and  standing    is best    can not  lay on left  side   Hip popping   on and off     Pt needs  ortho  referral     Wife present       3 views of the left hip including AP pelvis.     COMPARISON:  04/19/2022     FINDINGS:   Patient is status post ORIF within the left femur.  Normal alignment.  Soft tissues are unremarkable.        IMPRESSION:  NO ACUTE FRACTURE OR DISLOCATION.      If acute hip fracture is suspected after a fall or minor trauma and initial radiographs are negative then MRI of the pelvis and affected hip without IV contrast or CT of the pelvis and hips without IV contrast is usually appropriate as the next imaging study. (ACR Appropriateness Criteria: Acute Hip  Pain-Suspected Fracture, 2018)       Post Ed Follow-Up   Post ED Follow-Up:   Document completed and/or attempted interactive contact(s) after transition to home after emergency department stay.:   Transition Facility and relevant Date:   Discharge Date: 12/22/23  Discharge from [facility] Emergency Department?: Yes  Discharge Facility: [facility]  Contacted by: Kriste Basque pt went to [facility]  Contact method: Patient/Caregiver Telephone  Contact completed: 12/24/2023  9:36 AM  Contact first attempt: 12/24/2023  9:36 AM  MyChart message sent?: No  Was the AVS reviewed with patient?: No  How is the patient recovering?: Improving  Medications prescribed: Yes  Were they obtained?: Yes  Pt states doing better but would like to get injection                      REVIEW OF SYSTEMS:   Review of Systems  Review of Systems have been reviewed  ROS  Const  Reports system reviewed and no additional complaints, except as documented  ENT  Reports system reviewed and no additional complaints, except as documented  Resp  Reports system reviewed and no additional complaints, except as documented  GI  Reports system reviewed and no additional complaints, except as documented     Objective:   BP 120/80   Pulse 90   SpO2 96%              PHYSICAL EXAM  Physical Exam  Gen: NAD. Alert.   Heart  RRR wo mgr   Lungs  cta wo rrw    Left  hip tenderness anterior  groin   pain upon standing  stiffness with movement   Alignment  good   able to do straight leg raises but sore in  hip and groin  Spine wo  tenderness to palpation   No orders of the defined types were placed in this encounter.     Assessment & Plan  Fall    Pain of left hip    Laxity of left hip    Numbness and tingling of left leg    ER records reviewed    PT referral   Ortho referral   Depo  80 IM x  1  and prednisone  20 mg bid  x  5 days     Heat  Ice to area    topical rubs

## 2023-12-24 NOTE — Telephone Encounter (Signed)
Post Ed Follow-Up    Post ED Follow-Up:   Document completed and/or attempted interactive contact(s) after transition to home after emergency department stay.:   Transition Facility and relevant Date:   Discharge Date: 12/22/23  Discharge from [facility] Emergency Department?: Yes  Discharge Facility: [facility]  Contacted by: Kriste Basque pt went to [facility]  Contact method: Patient/Caregiver Telephone  Contact completed: 12/24/2023  9:36 AM  Contact first attempt: 12/24/2023  9:36 AM  MyChart message sent?: No  Was the AVS reviewed with patient?: No  How is the patient recovering?: Improving  Medications prescribed: Yes  Were they obtained?: Yes  Pt states doing better but would like to get injection

## 2023-12-26 ENCOUNTER — Telehealth (INDEPENDENT_AMBULATORY_CARE_PROVIDER_SITE_OTHER): Payer: Self-pay | Admitting: Internal Medicine

## 2023-12-26 DIAGNOSIS — R200 Anesthesia of skin: Secondary | ICD-10-CM

## 2023-12-26 DIAGNOSIS — M25252 Flail joint, left hip: Secondary | ICD-10-CM

## 2023-12-26 DIAGNOSIS — M25552 Pain in left hip: Secondary | ICD-10-CM

## 2023-12-26 DIAGNOSIS — W19XXXA Unspecified fall, initial encounter: Secondary | ICD-10-CM

## 2024-01-02 ENCOUNTER — Ambulatory Visit: Payer: 59 | Admitting: Internal Medicine

## 2024-01-02 ENCOUNTER — Other Ambulatory Visit: Payer: Self-pay

## 2024-01-02 ENCOUNTER — Encounter (INDEPENDENT_AMBULATORY_CARE_PROVIDER_SITE_OTHER): Payer: Self-pay | Admitting: Internal Medicine

## 2024-01-02 VITALS — HR 101 | Temp 98.6°F

## 2024-01-02 DIAGNOSIS — J069 Acute upper respiratory infection, unspecified: Secondary | ICD-10-CM | POA: Insufficient documentation

## 2024-01-02 DIAGNOSIS — R059 Cough, unspecified: Secondary | ICD-10-CM | POA: Insufficient documentation

## 2024-01-02 LAB — POCT RAPID COVID-19 & FLU (AMB ONLY)
COVID-19 AG: NEGATIVE
INFLUENZA TYPE A: NEGATIVE
INFLUENZA TYPE B: NEGATIVE

## 2024-01-02 MED ORDER — AZITHROMYCIN 250 MG TABLET
ORAL_TABLET | ORAL | 0 refills | Status: AC
Start: 2024-01-02 — End: ?

## 2024-01-02 NOTE — Progress Notes (Unsigned)
INTERNAL MEDICINE, BUILDING A  [address]  [place] [address]  Operated by [facility]  Progress Note    Name: Andrew Collier MRN:  [id number]   Date: 01/02/2024 DOB:  10/01/1986 (37 y.o.)              Chief Complaint: Feel Sick (Patient  states since yesterday  cough chill    wife has pneu and bronchitis)       HPI: CISCO KINDT is a 38 y.o. male who complains of  chills   and cough x   1  day   no fever   No nvd   pt  feels like he is getting worse     Yellow sputum   but not   nasal drainage    Wife is sick + pneumonia      Fu  hip pain   and  has appt with  ortho this Friday  pt says he had another fall    Pt going to [facility] for evaluation      Allergies:  Allergies   Allergen Reactions    Latex  Other Adverse Reaction (Add comment)     unknown    Cephalexin  Other Adverse Reaction (Add comment)     Affect his sezure meds    Levetiracetam Mental Status Effect     Hears voices    Carbamazepine  Other Adverse Reaction (Add comment)     Unknown      Diphenhydramine  Other Adverse Reaction (Add comment)     Unknown      Morphine  Other Adverse Reaction (Add comment)     Heart rate drops       atorvastatin (LIPITOR) 20 mg Oral Tablet, Take 1 Tablet (20 mg total) by mouth Once a day  dicyclomine (BENTYL) 20 mg Oral Tablet, Take 1 Tablet (20 mg total) by mouth Four times a day  eluxadoline (VIBERZI) 100 mg Oral Tablet, TAKE ONE TABLET BY MOUTH TWICE A DAY  famotidine (PEPCID) 40 mg Oral Tablet, Take 1 Tablet (40 mg total) by mouth Twice daily  gabapentin (NEURONTIN) 600 mg Oral Tablet, Take 1 Tablet (600 mg total) by mouth Three times a day  Ibuprofen (MOTRIN) 800 mg Oral Tablet, TAKE 1 TABLET BY MOUTH 3 TIMES A DAY AS NEEDED FOR PAIN  ketorolac tromethamine (TORADOL) 10 mg Oral Tablet, Take 1 Tablet (10 mg total) by mouth Every 6 hours as needed for Pain  lamoTRIgine (LAMICTAL) 200 mg Oral Tablet, TAKE 1 TABLET BY MOUTH TWICE A DAY FOR MOOD DISORDER  Levocetirizine (XYZAL) 5 mg Oral Tablet,  Take 1 Tablet (5 mg total) by mouth Every evening  LORazepam (ATIVAN) 0.5 mg Oral Tablet, Take 1 Tablet (0.5 mg total) by mouth Three times a day  meloxicam (MOBIC) 15 mg Oral Tablet, Take 1 Tablet (15 mg total) by mouth Once a day  metoprolol succinate (TOPROL-XL) 25 mg Oral Tablet Sustained Release 24 hr, Take 1 Tablet (25 mg total) by mouth Once a day  montelukast (SINGULAIR) 10 mg Oral Tablet, Take 1 Tablet (10 mg total) by mouth Every evening for 90 days  mupirocin (BACTROBAN) 2 % Ointment, Apply topically Three times a day  omeprazole (PRILOSEC) 40 mg Oral Capsule, Delayed Release(E.C.), Take 1 Capsule (40 mg total) by mouth Once a day  ondansetron (ZOFRAN ODT) 4 mg Oral Tablet, Rapid Dissolve, DISSOLVE ONE TABLET ON TONGUE EVERY 8 HOURS AS NEEDED  sucralfate (CARAFATE) 1 gram Oral Tablet, Take  1 Tablet (1 g total) by mouth Twice a day before meals  traMADoL (ULTRAM) 50 mg Oral Tablet, Take 1 Tablet (50 mg total) by mouth Every 6 hours as needed for Pain Indications: pain, acute abdominal pain    No facility-administered medications prior to visit.       Review of Systems -   ROS  Const  Reports system reviewed and no additional complaints, except as documented  ENT  Reports system reviewed and no additional complaints, except as documented  Resp  Reports system reviewed and no additional complaints, except as documented  GI  Reports system reviewed and no additional complaints, except as documented   OBJECTIVE:  Vitals:    01/02/24 1511   Pulse: (!) 101   Temp: 37 C (98.6 F)   SpO2: 95%      Physical Exam Exam-AMB  Const  General: cooperative, no acute distress and alert  EYE: PERRL  Nl Conjunctiva  Sclera nonicteric   HENMT  Head: normal to inspection  General nose exam: external nose normal  Neck  Neck: normal visual inspection  Lymphatic: no lymphadenopathy noted  Resp  Auscultation: clear  but rhonchi  with cough       RAPID FLU A/B and COVID RESULTS               negative               ASSESSMENT:      ICD-10-CM    1. Cough, unspecified type  R05.9 POCT Rapid Covid & Flu (AMB Only)      2. Upper respiratory tract infection, unspecified type  J06.9           Orders Placed This Encounter    POCT Rapid Covid & Flu (AMB Only)    azithromycin (ZITHROMAX) 250 mg Oral Tablet        PLAN: Treatment per orders . Call or return to clinic prn if these symptoms worsen or fail to improve as anticipated.  Meds reviewed as well as labs.  Pt to see Rowan Blase   Friday re  hip / let     Z-Pak as directed  monitor symptoms      Samuella Cota, PA-C

## 2024-01-15 ENCOUNTER — Telehealth (INDEPENDENT_AMBULATORY_CARE_PROVIDER_SITE_OTHER): Payer: Self-pay | Admitting: Internal Medicine

## 2024-01-24 ENCOUNTER — Telehealth (INDEPENDENT_AMBULATORY_CARE_PROVIDER_SITE_OTHER): Payer: Self-pay | Admitting: Internal Medicine

## 2024-01-29 ENCOUNTER — Other Ambulatory Visit (INDEPENDENT_AMBULATORY_CARE_PROVIDER_SITE_OTHER): Payer: Self-pay | Admitting: Internal Medicine

## 2024-01-29 ENCOUNTER — Telehealth (INDEPENDENT_AMBULATORY_CARE_PROVIDER_SITE_OTHER): Payer: Self-pay | Admitting: Internal Medicine

## 2024-01-30 MED ORDER — LORAZEPAM 0.5 MG TABLET
0.5000 mg | ORAL_TABLET | Freq: Three times a day (TID) | ORAL | 1 refills | Status: DC
Start: 2024-01-30 — End: 2024-02-28

## 2024-02-18 ENCOUNTER — Ambulatory Visit (INDEPENDENT_AMBULATORY_CARE_PROVIDER_SITE_OTHER): Admitting: Internal Medicine

## 2024-02-28 ENCOUNTER — Other Ambulatory Visit (INDEPENDENT_AMBULATORY_CARE_PROVIDER_SITE_OTHER): Payer: Self-pay | Admitting: Internal Medicine

## 2024-02-29 MED ORDER — OMEPRAZOLE 40 MG CAPSULE,DELAYED RELEASE
40.0000 mg | DELAYED_RELEASE_CAPSULE | Freq: Every day | ORAL | 0 refills | Status: AC
Start: 2024-02-29 — End: ?

## 2024-02-29 MED ORDER — LORAZEPAM 0.5 MG TABLET
0.5000 mg | ORAL_TABLET | Freq: Three times a day (TID) | ORAL | 0 refills | Status: DC
Start: 2024-02-29 — End: 2024-06-24

## 2024-02-29 MED ORDER — FAMOTIDINE 40 MG TABLET
40.0000 mg | ORAL_TABLET | Freq: Two times a day (BID) | ORAL | 2 refills | Status: AC
Start: 2024-02-29 — End: ?

## 2024-02-29 MED ORDER — GABAPENTIN 600 MG TABLET
600.0000 mg | ORAL_TABLET | Freq: Three times a day (TID) | ORAL | 0 refills | Status: AC
Start: 2024-02-29 — End: ?

## 2024-02-29 MED ORDER — ONDANSETRON 4 MG DISINTEGRATING TABLET
4.0000 mg | ORAL_TABLET | Freq: Three times a day (TID) | ORAL | 0 refills | Status: DC | PRN
Start: 2024-02-29 — End: 2024-05-15

## 2024-03-06 ENCOUNTER — Emergency Department (HOSPITAL_COMMUNITY)

## 2024-03-06 ENCOUNTER — Other Ambulatory Visit: Payer: Self-pay

## 2024-03-06 ENCOUNTER — Encounter (HOSPITAL_BASED_OUTPATIENT_CLINIC_OR_DEPARTMENT_OTHER): Payer: Self-pay

## 2024-03-06 ENCOUNTER — Emergency Department
Admission: EM | Admit: 2024-03-06 | Discharge: 2024-03-07 | Disposition: A | Discharge status: Home / Self Care | Attending: Emergency Medicine | Admitting: Emergency Medicine

## 2024-03-06 DIAGNOSIS — N451 Epididymitis: Secondary | ICD-10-CM | POA: Insufficient documentation

## 2024-03-06 DIAGNOSIS — K4090 Unilateral inguinal hernia, without obstruction or gangrene, not specified as recurrent: Secondary | ICD-10-CM | POA: Insufficient documentation

## 2024-03-06 DIAGNOSIS — N5082 Scrotal pain: Secondary | ICD-10-CM | POA: Insufficient documentation

## 2024-03-06 LAB — URINALYSIS, MACRO/MICRO
BILIRUBIN: NEGATIVE mg/dL
BLOOD: NEGATIVE mg/dL
GLUCOSE: NEGATIVE mg/dL
LEUKOCYTES: NEGATIVE WBCs/uL
NITRITE: NEGATIVE
PH: 7 (ref 4.6–8.0)
PROTEIN: NEGATIVE mg/dL
SPECIFIC GRAVITY: 1.015 (ref 1.003–1.035)
UROBILINOGEN: 0.2 mg/dL (ref 0.2–1.0)

## 2024-03-06 MED ORDER — DOXYCYCLINE HYCLATE 100 MG CAPSULE
100.0000 mg | ORAL_CAPSULE | Freq: Two times a day (BID) | ORAL | 0 refills | Status: AC
Start: 2024-03-06 — End: 2024-03-16

## 2024-03-06 MED ORDER — DOXYCYCLINE HYCLATE 100 MG TABLET
100.0000 mg | ORAL_TABLET | ORAL | Status: AC
Start: 2024-03-07 — End: 2024-03-07
  Administered 2024-03-07: 100 mg via ORAL

## 2024-03-06 MED ORDER — CEFTRIAXONE 500 MG SOLUTION FOR INJECTION
INTRAMUSCULAR | Status: AC
Start: 2024-03-06 — End: 2024-03-06
  Filled 2024-03-06: qty 5

## 2024-03-06 MED ORDER — DOXYCYCLINE HYCLATE 100 MG TABLET
ORAL_TABLET | ORAL | Status: AC
Start: 2024-03-06 — End: 2024-03-06
  Filled 2024-03-06: qty 1

## 2024-03-06 MED ORDER — HYDROCODONE 5 MG-ACETAMINOPHEN 325 MG TABLET
2.0000 | ORAL_TABLET | ORAL | Status: AC
Start: 2024-03-06 — End: 2024-03-06
  Administered 2024-03-06: 2 via ORAL

## 2024-03-06 MED ORDER — LIDOCAINE HCL 10 MG/ML (1 %) INJECTION SOLUTION
500.0000 mg | INTRAMUSCULAR | Status: AC
Start: 2024-03-07 — End: 2024-03-07
  Administered 2024-03-07: 500 mg via INTRAMUSCULAR

## 2024-03-06 MED ORDER — CLONIDINE HCL 0.1 MG TABLET
ORAL_TABLET | ORAL | Status: AC
Start: 2024-03-06 — End: 2024-03-06
  Filled 2024-03-06: qty 1

## 2024-03-06 MED ORDER — HYDROCODONE 5 MG-ACETAMINOPHEN 325 MG TABLET
ORAL_TABLET | ORAL | Status: AC
Start: 2024-03-06 — End: 2024-03-06
  Filled 2024-03-06: qty 2

## 2024-03-06 MED ORDER — CLONIDINE HCL 0.1 MG TABLET
0.1000 mg | ORAL_TABLET | ORAL | Status: AC
Start: 2024-03-06 — End: 2024-03-06
  Administered 2024-03-06: 0.1 mg via ORAL

## 2024-03-06 NOTE — Discharge Instructions (Addendum)
 Take doxycycline twice a day for the next 10 days  Take a Sitz bath 4 times per day  Follow up with the primary care provider for recheck on Monday/Tuesday  Return to emergency room for any increased discomfort/swelling, fever, or any concerns

## 2024-03-06 NOTE — ED Attending Note (Signed)
 [facility]  ED Primary Provider Note        Arrival: The patient arrived by Car     History of Present Illness   chief complaint  Andrew Collier is a 38 y.o. male who had concerns including Groin Pain.  Patient 38 year old male presents emergency room with a chief complaint of tenderness and swelling to the posterior right testicle.  Onset earlier today.  Is had recent viral gastroenteritis.  Is denying any dysuria.  No prior history of similar.  Patient transferred from [facility] emergency room for scrotal ultrasound.  Patient rates pain as 3/10.  Aching in nature.  Blood pressure 118/98 with a heart rate of 90, respiratory rate of 18 with a oxygen saturation 96% on room air.  Afebrile at 97.9  All nursing notes reviewed        Review of Systems     No other overt Review of Systems are noted to be positive except noted in the HPI.      Historical Data   History Reviewed This Encounter: Medical History  Surgical History  Family History  Social History      Physical Exam   ED Triage Vitals [03/06/24 1939]   BP (Non-Invasive) (!) 153/109   Heart Rate (!) 106   Respiratory Rate 18   Temperature 36.6 C (97.9 F)   SpO2 96 %   Weight 90.7 kg (200 lb)   Height 1.651 m (5\' 5" )         Exam:   Constitutional:  Patient alert orient x3 in no apparent distress.  No limitations.  Head: Atraumatic normocephalic  Eyes :  Pupils are equal round reactive to light and accommodation extraocular muscles are intact.  Sclera and conjunctiva are unremarkable  Ears:  Tympanic membranes are pearly gray bilaterally; external auditory canals are unremarkable; external ears without any lesions  Nose:  Nares are patent turbinates are pink and moist  Mouth:  Mucosa is pink and moist without lesions.  Posterior pharynx is pink and moist without hypertrophy/exudate.  Neck:  Soft and supple without palpable lymphadenopathy.  Heart:  Regular rate and rhythm without audible murmur  Lungs:  Clear to auscultation  bilaterally without any wheezing/rales/rhonchi  Abdomen:  Soft nontender without any rebound or guarding; positive bowel sounds throughout  Genitalia:  Patient has mild tenderness and swelling to the posterior right testicle/epididymal region.  Consistent with epididymitis.  Skin:  Warm and dry without lesions.  Normal skin turgor.  Brisk capillary refill distally  Extremities:  Good strength bilaterally with full range of motion of upper and lower extremities.  Neuro:  Alert oriented x3.  Cranial nerves II-XII grossly intact as tested.  Excellent sensation distally over all dermatomes.  Psychiatric:  Patient cooperative, affect appropriate, insight and judgment good          Procedures           Patient Data     Labs Ordered/Reviewed   URINALYSIS, MACRO/MICRO - Abnormal; Notable for the following components:       Result Value    KETONES Trace (*)     All other components within normal limits   URINALYSIS WITH REFLEX MICROSCOPIC AND CULTURE IF POSITIVE    Narrative:     The following orders were created for panel order URINALYSIS WITH REFLEX MICROSCOPIC AND CULTURE IF POSITIVE.  Procedure  Abnormality         Status                     ---------                               -----------         ------                     URINALYSIS, MACRO/MICRO[[id number]]      Abnormal            Final result                 Please view results for these tests on the individual orders.   CHLAMYDIA / NEISSERIA DNA BY PCR       US SCROTUM   Final Result by Edi, Radresults In (04/10 2205)   NO TESTICULAR MASS. NO TESTICULAR TORSION.      RIGHT INGUINAL HERNIA.               Radiologist location ID: [id number]             Medical Decision Making          Medical Decision Making  Patient is mild tenderness and swelling to the posterior right testicle/epididymal region.  Scrotal ultrasound was unremarkable.  Urinalysis unremarkable.  Patient given ceftriaxone 500 mg IM here in emergency room.  Is states is  allergic to cephalexin; however, is tolerated Rocephin in the past.  Patient also be placed on doxycycline 100 mg p.o. b.i.d. times 10 days.  Initial dose given here in emergency room.  Patient advised to follow up with the primary care provider for recheck on Monday/Tuesday.  See discharge instructions for detailed    Amount and/or Complexity of Data Reviewed  Labs: ordered. Decision-making details documented in ED Course.  Radiology: ordered and independent interpretation performed. Decision-making details documented in ED Course.    Risk  Prescription drug management.    Critical Care  Total time providing critical care: 0 minutes        ED Course as of 03/06/24 2337   Thu Mar 06, 2024   2119 Urinalysis shows trace of ketones otherwise unremarkable   2249 Bilateral scrotal ultrasound:FINDINGS:  RIGHT testicle:  4.3 x 2.8 cm x 2.1 cmcm.  Homogeneous echotexture.  No intratesticular mass.     Right epididymis: Heterogeneous with normal color-flow.     LEFT testicle:  4.2 x 2.8 x 2.7cm.  Homogeneous echotexture.  No intratesticular mass.     Left epididymis: Unremarkable        Other findings: Small left hydrocele.     Right inguinal hernia with peritoneal fat moving in and out during Valsalva maneuver.        IMPRESSION:  NO TESTICULAR MASS. NO TESTICULAR TORSION.     RIGHT INGUINAL HERNIA.              Medications Ordered/Administered in the ED   cefTRIAXone (ROCEPHIN) 500 mg in lidocaine 1.43 mL (tot vol) IM injection (has no administration in time range)   doxycycline tablet (has no administration in time range)   cloNIDine (CATAPRES) tablet (0.1 mg Oral Given 03/06/24 2008)   HYDROcodone-acetaminophen (NORCO) 5-325 mg per tablet (2 Tablets Oral Given 03/06/24 2007)       Following the history, physical exam, and ED workup, the patient was deemed stable and  suitable for discharge. The patient/caregiver was advised to return to the ED for any new or worsening symptoms. Discharge medications, and follow-up  instructions were discussed with the patient/caregiver in detail, who verbalizes understanding. The patient/caregiver is in agreement and is comfortable with the plan of care.    Disposition: Discharged         Current Discharge Medication List        START taking these medications.        Details   doxycycline hyclate 100 mg Capsule  Commonly known as: VIBRAMYCIN   100 mg, Oral, 2 TIMES DAILY  Qty: 20 Capsule  Refills: 0            CONTINUE these medications - NO CHANGES were made during your visit.        Details   atorvastatin 20 mg Tablet  Commonly known as: LIPITOR   20 mg, Oral, Daily  Qty: 90 Tablet  Refills: 2     azithromycin 250 mg Tablet  Commonly known as: ZITHROMAX   Take 500 mg (2 tab) on day 1; take 250 mg (1 tab) on days 2-5.  Qty: 6 Tablet  Refills: 0     dicyclomine 20 mg Tablet  Commonly known as: BENTYL   20 mg, Oral, 4 TIMES DAILY  Qty: 30 Tablet  Refills: 1     famotidine 40 mg Tablet  Commonly known as: PEPCID   40 mg, Oral, 2 TIMES DAILY  Qty: 60 Tablet  Refills: 2     gabapentin 600 mg Tablet  Commonly known as: NEURONTIN   600 mg, Oral, 3 TIMES DAILY  Qty: 90 Tablet  Refills: 0     Ibuprofen 800 mg Tablet  Commonly known as: MOTRIN   TAKE 1 TABLET BY MOUTH 3 TIMES A DAY AS NEEDED FOR PAIN  Qty: 90 Tablet  Refills: 2     ketorolac tromethamine 10 mg Tablet  Commonly known as: TORADOL   10 mg, Oral, EVERY 6 HOURS PRN  Qty: 12 Tablet  Refills: 0     lamoTRIgine 200 mg Tablet  Commonly known as: LaMICtal   TAKE 1 TABLET BY MOUTH TWICE A DAY FOR MOOD DISORDER  Qty: 180 Tablet  Refills: 1     Levocetirizine 5 mg Tablet  Commonly known as: XYZAL   5 mg, Oral, EVERY EVENING  Qty: 90 Tablet  Refills: 3     LORazepam 0.5 mg Tablet  Commonly known as: ATIVAN   0.5 mg, Oral, 3 TIMES DAILY  Qty: 90 Tablet  Refills: 0     meloxicam 15 mg Tablet  Commonly known as: MOBIC   15 mg, Oral, Daily  Qty: 30 Tablet  Refills: 0     metoprolol succinate 25 mg Tablet Sustained Release 24 hr  Commonly known as:  TOPROL-XL   25 mg, Oral, Daily  Refills: 0     montelukast 10 mg Tablet  Commonly known as: SINGULAIR   10 mg, Oral, EVERY EVENING  Qty: 90 Tablet  Refills: 3     mupirocin 2 % Ointment  Commonly known as: BACTROBAN   Apply Topically, 3 TIMES DAILY  Qty: 30 g  Refills: 0     omeprazole 40 mg Capsule, Delayed Release(E.C.)  Commonly known as: PRILOSEC   40 mg, Oral, Daily  Qty: 90 Capsule  Refills: 0     ondansetron 4 mg Tablet, Rapid Dissolve  Commonly known as: ZOFRAN ODT   4 mg, Oral, EVERY 8 HOURS PRN  Qty: 30 Tablet  Refills: 0     sucralfate 1 gram Tablet  Commonly known as: CARAFATE   1 g, Oral, 2 TIMES DAILY BEFORE MEALS  Refills: 0     traMADoL 50 mg Tablet  Commonly known as: ULTRAM   50 mg, Oral, EVERY 6 HOURS PRN  Qty: 12 Tablet  Refills: 1     Viberzi 100 mg Tablet  Generic drug: eluxadoline   100 mg, Oral, 2 TIMES DAILY  Qty: 60 Tablet  Refills: 3            Follow up:   Samuella Cota, PA-C  [address]  [address]  [place] [address]  [phone]      Follow up with the primary care provider for recheck on Monday/Tuesday                 Clinical Impression   Scrotal pain   Epididymitis, right (Primary)         Current Discharge Medication List        START taking these medications    Details   doxycycline hyclate (VIBRAMYCIN) 100 mg Oral Capsule Take 1 Capsule (100 mg total) by mouth Twice daily for 10 days  Qty: 20 Capsule, Refills: 0             R.A. Santiago Bumpers, DO  Department of Emergency Medicine

## 2024-03-06 NOTE — ED Nurses Note (Signed)
 Pt reports swelling/pain right testicle since 7am today. On examination by myself and MD right testicle noted to be tender to palpation, red and slightly edematous.

## 2024-03-06 NOTE — ED Nurses Note (Signed)
 Pt to [facility] ED for U/S via private vehicle per MD. Pt driven by significant other

## 2024-03-06 NOTE — ED Nurses Note (Signed)
 Pt started having rt testicle pain, redness, and swelling this AM. States he had been vomiting the night before. Was sent here from [facility] for [place]

## 2024-03-06 NOTE — ED Nurses Note (Signed)
 Report called to Ardine Eng at [facility] ED

## 2024-03-06 NOTE — ED Provider Notes (Addendum)
  [facility] emergency department         HISTORY OF PRESENT ILLNESS     Date:  03/06/2024  Patient's Name:  Andrew Collier  Date of Birth:  21-Dec-1985    Patient is a 38 year old presenting with right side scrotal pain and swelling onset at 7:00 a.m. today.  Patient denies nausea or vomiting there has been no fever.  Patient denies any penile discharge or dysuria.        Review of Systems     Review of Systems   Constitutional: Negative.    HENT: Negative.     Eyes: Negative.    Cardiovascular: Negative.    Gastrointestinal: Negative.    Endocrine: Negative.    Genitourinary:  Positive for scrotal swelling and testicular pain.   Psychiatric/Behavioral: Negative.     All other systems reviewed and are negative.      Previous History     Past Medical History:  Past Medical History:   Diagnosis Date    Circadian rhythm sleep disorder     Epilepsy     GAD (generalized anxiety disorder)     Osteoarthritis     Paroxysmal supraventricular tachycardia (CMS HCC)        Past Surgical History:  Past Surgical History:   Procedure Laterality Date    Bladder surgery N/A     Colonoscopy N/A     Hx appendectomy N/A     Hx cholecystectomy      Hx hip replacement Left     Hx shoulder surgery Left     Hx upper endoscopy N/A     Hx vasectomy N/A     Orthopedic surgery Left     Renal artery stent N/A     Septoplasty N/A        Social History:  Social History     Tobacco Use    Smoking status: Never    Smokeless tobacco: Never   Vaping Use    Vaping status: Never Used   Substance Use Topics    Alcohol use: Never    Drug use: Never     Social History     Substance and Sexual Activity   Drug Use Never       Family History:  Family History   Problem Relation Age of Onset    Hypertension (High Blood Pressure) Mother     Elevated Lipids Mother     Arthritis-rheumatoid Mother     Elevated Lipids Father        Medication History:  Current Outpatient Medications   Medication Sig    atorvastatin (LIPITOR) 20  mg Oral Tablet Take 1 Tablet (20 mg total) by mouth Once a day    azithromycin (ZITHROMAX) 250 mg Oral Tablet Take 500 mg (2 tab) on day 1; take 250 mg (1 tab) on days 2-5.    dicyclomine (BENTYL) 20 mg Oral Tablet Take 1 Tablet (20 mg total) by mouth Four times a day    eluxadoline (VIBERZI) 100 mg Oral Tablet TAKE ONE TABLET BY MOUTH TWICE A DAY    famotidine (PEPCID) 40 mg Oral Tablet Take 1 Tablet (40 mg total) by mouth Twice daily    gabapentin (NEURONTIN) 600 mg Oral Tablet Take 1 Tablet (600 mg total) by mouth Three times a day    Ibuprofen (MOTRIN) 800 mg Oral Tablet TAKE 1 TABLET BY MOUTH 3 TIMES A DAY AS NEEDED FOR PAIN    ketorolac tromethamine (TORADOL) 10  mg Oral Tablet Take 1 Tablet (10 mg total) by mouth Every 6 hours as needed for Pain    lamoTRIgine (LAMICTAL) 200 mg Oral Tablet TAKE 1 TABLET BY MOUTH TWICE A DAY FOR MOOD DISORDER    Levocetirizine (XYZAL) 5 mg Oral Tablet Take 1 Tablet (5 mg total) by mouth Every evening    LORazepam (ATIVAN) 0.5 mg Oral Tablet Take 1 Tablet (0.5 mg total) by mouth Three times a day    meloxicam (MOBIC) 15 mg Oral Tablet Take 1 Tablet (15 mg total) by mouth Once a day    metoprolol succinate (TOPROL-XL) 25 mg Oral Tablet Sustained Release 24 hr Take 1 Tablet (25 mg total) by mouth Once a day    montelukast (SINGULAIR) 10 mg Oral Tablet Take 1 Tablet (10 mg total) by mouth Every evening for 90 days    mupirocin (BACTROBAN) 2 % Ointment Apply topically Three times a day    omeprazole (PRILOSEC) 40 mg Oral Capsule, Delayed Release(E.C.) Take 1 Capsule (40 mg total) by mouth Daily    ondansetron (ZOFRAN ODT) 4 mg Oral Tablet, Rapid Dissolve Take 1 Tablet (4 mg total) by mouth Every 8 hours as needed for Nausea/Vomiting    sucralfate (CARAFATE) 1 gram Oral Tablet Take 1 Tablet (1 g total) by mouth Twice a day before meals    traMADoL (ULTRAM) 50 mg Oral Tablet Take 1 Tablet (50 mg total) by mouth Every 6 hours as needed for Pain Indications: pain, acute abdominal pain        Allergies:  Allergies   Allergen Reactions    Latex  Other Adverse Reaction (Add comment)     unknown    Cephalexin  Other Adverse Reaction (Add comment)     Affect his sezure meds    Levetiracetam Mental Status Effect     Hears voices    Carbamazepine  Other Adverse Reaction (Add comment)     Unknown      Diphenhydramine  Other Adverse Reaction (Add comment)     Unknown      Morphine  Other Adverse Reaction (Add comment)     Heart rate drops       Physical Exam     Vitals:    BP (!) 157/101   Pulse 90   Temp 36.6 C (97.9 F)   Resp 18   Ht 1.651 m (5\' 5" )   Wt 90.7 kg (200 lb)   SpO2 94%   BMI 33.28 kg/m           Physical Exam  Vitals and nursing note reviewed.   Constitutional:       General: He is not in acute distress.     Appearance: Normal appearance. He is well-developed and normal weight.   HENT:      Head: Normocephalic and atraumatic.      Right Ear: Tympanic membrane and ear canal normal.      Left Ear: Tympanic membrane and ear canal normal.      Nose: Nose normal.      Mouth/Throat:      Mouth: Mucous membranes are moist.   Eyes:      Conjunctiva/sclera: Conjunctivae normal.      Pupils: Pupils are equal, round, and reactive to light.   Cardiovascular:      Rate and Rhythm: Normal rate and regular rhythm.      Pulses: Normal pulses.      Heart sounds: No murmur heard.  Pulmonary:  Effort: Pulmonary effort is normal. No respiratory distress.      Breath sounds: Normal breath sounds.   Abdominal:      General: Bowel sounds are normal.      Palpations: Abdomen is soft.      Tenderness: There is no abdominal tenderness.   Genitourinary:     Penis: Normal.       Testes:         Right: Tenderness and swelling present.      Epididymis:      Right: Tenderness present.          Comments: Right scrotum with fullness and tenderness no retraction  Musculoskeletal:         General: No swelling. Normal range of motion.      Cervical back: Normal range of motion and neck supple.   Skin:      General: Skin is warm and dry.      Capillary Refill: Capillary refill takes less than 2 seconds.   Neurological:      General: No focal deficit present.      Mental Status: He is alert and oriented to person, place, and time.   Psychiatric:         Mood and Affect: Mood normal.         Behavior: Behavior normal.         Diagnostic Studies/Treatment     Medications:  Medications Ordered/Administered in the ED   cloNIDine (CATAPRES) tablet (0.1 mg Oral Given 03/06/24 2008)   HYDROcodone-acetaminophen (NORCO) 5-325 mg per tablet (2 Tablets Oral Given 03/06/24 2007)       New Prescriptions    No medications on file       Labs:    No results found for this or any previous visit (from the past 12 hours).     Radiology:  None    No orders to display       ECG:  NONE            Differential diagnosis  Testicular pain, right scrotal pain, epididymitis,  hydrocele, varicocele  Course/Disposition/Plan     Course:     ED Course as of 03/06/24 2027   Thu Mar 06, 2024   2004 Patient with right testicular pain onset 7:00 a.m. this morning.  Pain and swelling right scrotum will consult [facility] regarding ultrasound scrotum   2027 Patient will be transported POV by his wife to the [facility] ER for ultrasound scrotum       Disposition:    Transfered to Another Facility    Condition at Disposition:     Stable  Follow up:   No follow-up provider specified.    Clinical Impression:     Clinical Impression   Scrotal pain (Primary)         Tivis Ringer, MD

## 2024-03-08 ENCOUNTER — Telehealth (HOSPITAL_COMMUNITY): Payer: Self-pay | Admitting: Internal Medicine

## 2024-03-08 NOTE — Telephone Encounter (Signed)
 Post Ed Follow-Up    Post ED Follow-Up:   Document completed and/or attempted interactive contact(s) after transition to home after emergency department stay.:   Transition Facility and relevant Date:   Discharge Date: 03/07/24  Discharge from [facility] Emergency Department?: Yes  Discharge Facility: [facility]  Contacted by: Leda Prude RN  Contact method: Patient/Caregiver Telephone  Contact first attempt: 03/08/2024  2:31 PM  MyChart message sent?: No  Medications prescribed: Yes  Follow Up Visit: No answer - left message

## 2024-04-01 NOTE — Progress Notes (Signed)
 General Surgery Postoperative Visit Note    Subjective  Andrew Collier is a 38 y.o. male presenting for follow up 2 weeks after I performed robotic bilateral inguinal hernia repair with mesh. He is doing well, incisional pain is controlled, tolerating regular diet, having normal bowel function and ambulating.    Objective  There were no vitals filed for this visit.  Abdomen: Soft, non tender, non distended, Incisions are clean, dry and intact, healing well, no surrounding erythema    Final pathology:  No results found for: FINALDX     Assessment and Plan  No diagnosis found.  2 Weeks s/p robotic bilateral inguinal hernia repair with mesh, recovering well.    -No heavy lifting for 6 weeks total after surgery  -Resume normal activities  -Return to clinic as needed

## 2024-05-15 ENCOUNTER — Other Ambulatory Visit (INDEPENDENT_AMBULATORY_CARE_PROVIDER_SITE_OTHER): Payer: Self-pay | Admitting: Internal Medicine

## 2024-05-15 MED ORDER — ONDANSETRON 4 MG DISINTEGRATING TABLET
4.0000 mg | ORAL_TABLET | Freq: Three times a day (TID) | ORAL | 0 refills | Status: AC | PRN
Start: 2024-05-15 — End: ?

## 2024-05-20 ENCOUNTER — Other Ambulatory Visit: Payer: Self-pay

## 2024-05-20 ENCOUNTER — Ambulatory Visit: Attending: Internal Medicine | Admitting: Internal Medicine

## 2024-05-20 ENCOUNTER — Encounter (INDEPENDENT_AMBULATORY_CARE_PROVIDER_SITE_OTHER): Payer: Self-pay | Admitting: Internal Medicine

## 2024-05-20 VITALS — BP 128/80 | HR 109 | Temp 97.7°F | Ht 65.0 in | Wt 214.0 lb

## 2024-05-20 DIAGNOSIS — K9041 Non-celiac gluten sensitivity: Secondary | ICD-10-CM | POA: Insufficient documentation

## 2024-05-20 DIAGNOSIS — R202 Paresthesia of skin: Secondary | ICD-10-CM | POA: Insufficient documentation

## 2024-05-20 DIAGNOSIS — M1990 Unspecified osteoarthritis, unspecified site: Secondary | ICD-10-CM | POA: Insufficient documentation

## 2024-05-20 DIAGNOSIS — K219 Gastro-esophageal reflux disease without esophagitis: Secondary | ICD-10-CM | POA: Insufficient documentation

## 2024-05-20 DIAGNOSIS — R200 Anesthesia of skin: Secondary | ICD-10-CM | POA: Insufficient documentation

## 2024-05-20 DIAGNOSIS — M25552 Pain in left hip: Secondary | ICD-10-CM | POA: Insufficient documentation

## 2024-05-20 DIAGNOSIS — E785 Hyperlipidemia, unspecified: Secondary | ICD-10-CM | POA: Insufficient documentation

## 2024-05-20 MED ORDER — ATORVASTATIN 20 MG TABLET
20.0000 mg | ORAL_TABLET | Freq: Every day | ORAL | 2 refills | Status: DC
Start: 2024-05-20 — End: 2024-05-23

## 2024-05-20 MED ORDER — DICYCLOMINE 20 MG TABLET
20.0000 mg | ORAL_TABLET | Freq: Four times a day (QID) | ORAL | 1 refills | Status: DC
Start: 2024-05-20 — End: 2024-06-24

## 2024-05-20 MED ORDER — GABAPENTIN 600 MG TABLET
600.0000 mg | ORAL_TABLET | Freq: Three times a day (TID) | ORAL | 2 refills | Status: AC
Start: 2024-05-20 — End: ?

## 2024-05-20 MED ORDER — OMEPRAZOLE 40 MG CAPSULE,DELAYED RELEASE
40.0000 mg | DELAYED_RELEASE_CAPSULE | Freq: Every day | ORAL | 3 refills | Status: AC
Start: 2024-05-20 — End: ?

## 2024-05-20 MED ORDER — LAMOTRIGINE 200 MG TABLET
200.0000 mg | ORAL_TABLET | Freq: Two times a day (BID) | ORAL | 2 refills | Status: AC
Start: 2024-05-20 — End: ?

## 2024-05-20 MED ORDER — MELOXICAM 15 MG TABLET
15.0000 mg | ORAL_TABLET | Freq: Every day | ORAL | 0 refills | Status: AC
Start: 2024-05-20 — End: ?

## 2024-05-20 MED ORDER — METOPROLOL SUCCINATE ER 25 MG TABLET,EXTENDED RELEASE 24 HR
25.0000 mg | ORAL_TABLET | Freq: Every day | ORAL | 2 refills | Status: AC
Start: 2024-05-20 — End: ?

## 2024-05-20 MED ORDER — LEVOCETIRIZINE 5 MG TABLET
5.0000 mg | ORAL_TABLET | Freq: Every evening | ORAL | 3 refills | Status: AC
Start: 2024-05-20 — End: ?

## 2024-05-20 MED ORDER — VIBERZI 100 MG TABLET
1.0000 | ORAL_TABLET | Freq: Two times a day (BID) | ORAL | 3 refills | Status: AC
Start: 2024-05-20 — End: ?

## 2024-05-20 MED ORDER — FAMOTIDINE 40 MG TABLET
40.0000 mg | ORAL_TABLET | Freq: Two times a day (BID) | ORAL | 3 refills | Status: AC
Start: 2024-05-20 — End: ?

## 2024-05-20 MED ORDER — ONDANSETRON 4 MG DISINTEGRATING TABLET
4.0000 mg | ORAL_TABLET | Freq: Three times a day (TID) | ORAL | 0 refills | Status: DC | PRN
Start: 2024-05-20 — End: 2024-06-22

## 2024-05-20 MED ORDER — MONTELUKAST 10 MG TABLET: 10.0000 mg | ORAL_TABLET | Freq: Every evening | ORAL | 3 refills | Status: AC

## 2024-05-20 NOTE — Telephone Encounter (Signed)
 Patients wife and patient states they are going to call the referring office and discuss before scheduling. Patients wife states they have our clinics call back number to schedule once discussed with referring provider

## 2024-05-20 NOTE — Nursing Note (Signed)
 4 month follow up pt states he needs handicap papers filled out states that he was trying to get surgery done but not yet states that he is numbness and tingling in his hip and leg

## 2024-05-20 NOTE — Telephone Encounter (Signed)
-----   Message from [place] D sent at 05/20/2024  8:50 AM EDT -----  Regarding: FW: reschedule with Dr. Kendell    ----- Message -----  From: Ronal DELENA Mon, LPN  Sent: 3/75/7974   8:47 AM EDT  To: [facility]  Subject: reschedule with Dr. Kendell                       Patient would like to reschedule with Dr. Kendell to discuss  labral tear and possible hip impingement. Patient was previously scheduled with TJ PA instead of Dr. Kendell.

## 2024-05-20 NOTE — Progress Notes (Signed)
 INTERNAL MEDICINE, BUILDING A  [address]  [place] [address]  Operated by [facility]      Name: Andrew Collier                       Date of Birth: [birth year]   MRN:  [id number]                         Date of visit: 05/20/2024     PCP: Suzen DELENA Asa, PA-C     Subjective  Andrew Collier is a 38 y.o. year old male who presents for Follow Up 4 Months (4 month follow up pt states he needs handicap papers filled out states that he was trying to get surgery done but not yet states that he is numbness and tingling in his hip and leg )   to clinic.  No specialty comments available.   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    GAD (generalized anxiety disorder) 01/26/2022    Osteoarthritis 01/26/2022    Thoracic back pain 01/26/2022    Diarrhea 01/26/2022      Current Outpatient Medications   Medication Sig    atorvastatin  (LIPITOR) 20 mg Oral Tablet Take 1 Tablet (20 mg total) by mouth Daily    dicyclomine  (BENTYL ) 20 mg Oral Tablet Take 1 Tablet (20 mg total) by mouth Four times a day    eluxadoline  (VIBERZI ) 100 mg Oral Tablet Take 1 Tablet (100 mg total) by mouth Twice daily    famotidine  (PEPCID ) 40 mg Oral Tablet Take 1 Tablet (40 mg total) by mouth Twice daily    gabapentin  (NEURONTIN ) 600 mg Oral Tablet Take 1 Tablet (600 mg total) by mouth Three times a day    lamoTRIgine (LAMICTAL) 200 mg Oral Tablet Take 1 Tablet (200 mg total) by mouth Twice daily    Levocetirizine (XYZAL) 5 mg Oral Tablet Take 1 Tablet (5 mg total) by mouth Every evening    LORazepam  (ATIVAN ) 0.5 mg Oral Tablet Take 1 Tablet (0.5 mg total) by mouth Three times a day    meloxicam  (MOBIC ) 15 mg Oral Tablet Take 1 Tablet (15 mg total) by mouth Daily    metoprolol succinate (TOPROL-XL) 25 mg Oral Tablet Sustained Release 24 hr Take 1 Tablet (25 mg total) by mouth Daily    montelukast   (SINGULAIR ) 10 mg Oral Tablet Take 1 Tablet (10 mg total) by mouth Every evening for 90 days    mupirocin  (BACTROBAN ) 2 % Ointment Apply topically Three times a day    omeprazole  (PRILOSEC) 40 mg Oral Capsule, Delayed Release(E.C.) Take 1 Capsule (40 mg total) by mouth Daily    ondansetron  (ZOFRAN  ODT) 4 mg Oral Tablet, Rapid Dissolve Take 1 Tablet (4 mg total) by mouth Every 8 hours as needed for Nausea/Vomiting        Fu Visit   Since last visit  pt is s/p  bilateral hernia repairs       Fu hip pain and 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     Pt needs to see specialist but  awaiting second appt  at [facility]    Dr Enola  Ortho sports med  referred   Awaiting for the Joint Specialist with [facility]    Left leg still going  numb     Fu abd spasm pt uses bentyl  prn     Fu AR pt is on xyxal      Fu Seizure disorder  Pt has not had a seizure in over 5 yrs  Pt has been doing well with current tx   Pt was followed by Dr Troy before he retired    Pt has upcoming appt with Dr Sherre Lamb GERD pt is on Prilosec  Pepcid     and  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  No chills.  No weight changes.  HEENT: No vision changes.  Cardiac: No chest pain. No palpitations.  No dizziness.  No light-headedness.  No near syncope.  Resp: No dyspnea at rest, no dyspnea on exertion; no cough or hemoptysis; no orthopnea or PND.  GI: No N/V. No melena.  No bright red blood per rectum.  Ext: No edema.  No claudication.  Neuro: No focal weakness.  No numbness.  All other ROS negative.      Objective: BP 128/80 (Site: Left Arm, Patient Position: Sitting)   Pulse (!) 109   Temp 36.5 C (97.7 F)   Ht 1.651 m (5' 5)   Wt 97.1 kg (214 lb)   SpO2 97%   BMI 35.61 kg/m                PHYSICAL EXAM  Physical Exam  Gen: NAD.  Alert.   HEENT: PERRL; conjunctivae clear. No JVD or carotid bruit.  Cardiac: RRR with normal S1, S2.   Lungs: Clear to auscultation bilaterally. No rales. No wheezing. No rhonchi.  Abdomen: Soft, non-tender.non-distended  nl bowel sounds    Extremities: No edema. No cyanosis. No clubbing.  Left groin pain   no spinal tenderness    Neurologic:  Grossly intact    Lab Results   Component Value Date    CHOLESTEROL 200 (H) 02/16/2023    HDLCHOL 40 02/16/2023    LDLCHOL 143 (H) 02/16/2023    TRIG 85 02/16/2023       COMPLETE BLOOD COUNT   Lab Results   Component Value Date    WBC 14.4 (H) 08/09/2023    HGB 17.4 (H) 08/09/2023    HCT 51.6 (H) 08/09/2023    PLTCNT 389 08/09/2023       DIFFERENTIAL  Lab Results   Component Value Date    PMNS 72 08/09/2023    LYMPHOCYTES 17 08/09/2023    MONOCYTES 9 08/09/2023    EOSINOPHIL 2 08/09/2023    BASOPHILS 1 08/09/2023    BASOPHILS 0.10 08/09/2023    PMNABS 10.40 (H) 08/09/2023    LYMPHSABS 2.40 08/09/2023    EOSABS 0.30 08/09/2023    MONOSABS 1.20 (H) 08/09/2023        COMPREHENSIVE METABOLIC PANEL  Lab Results   Component Value Date    SODIUM 138 08/01/2023    POTASSIUM 3.9 08/01/2023    CHLORIDE 106 08/01/2023    CO2 24 08/01/2023    ANIONGAP 8 08/01/2023    BUN 12 08/01/2023  CREATININE 0.68 08/01/2023    GLUCOSENF 88 08/01/2023    GLUCOSE Negative 03/06/2024    CALCIUM 10.0 08/01/2023    ALBUMIN 5.0 08/01/2023    TOTALPROTEIN 7.5 08/01/2023    ALKPHOS 71 08/01/2023    AST 20 08/01/2023    ALT 38 08/01/2023    GFR 124 08/01/2023                THYROID  STIMULATING HORMONE  Lab Results   Component Value Date    TSH 0.853 02/16/2023        No results found for: HA1C  No results found for: VITD      Orders Placed This Encounter    CBC/DIFF    COMPREHENSIVE METABOLIC PNL, FASTING    THYROID  STIMULATING HORMONE (SENSITIVE TSH)    LIPID PANEL    CBC WITH DIFF    dicyclomine  (BENTYL ) 20 mg Oral Tablet    eluxadoline  (VIBERZI ) 100 mg Oral Tablet    famotidine  (PEPCID ) 40 mg Oral  Tablet    gabapentin  (NEURONTIN ) 600 mg Oral Tablet    lamoTRIgine (LAMICTAL) 200 mg Oral Tablet    Levocetirizine (XYZAL) 5 mg Oral Tablet    meloxicam  (MOBIC ) 15 mg Oral Tablet    metoprolol succinate (TOPROL-XL) 25 mg Oral Tablet Sustained Release 24 hr    montelukast  (SINGULAIR ) 10 mg Oral Tablet    omeprazole  (PRILOSEC) 40 mg Oral Capsule, Delayed Release(E.C.)    ondansetron  (ZOFRAN  ODT) 4 mg Oral Tablet, Rapid Dissolve    atorvastatin  (LIPITOR) 20 mg Oral Tablet      Assessment & Plan  Gastroesophageal reflux disease without esophagitis    Osteoarthritis, unspecified osteoarthritis type, unspecified site    Pain of left hip    Numbness and tingling of left leg    Hyperlipidemia, unspecified hyperlipidemia type    Gastroesophageal reflux disease, unspecified whether esophagitis present    Gluten intolerance         Depression screening is negative. PHQ 2 Total: 0     Meds reviewed as well as labs.  Chart reviewed and updated.   Continue current treatment.  Keep follow-up appointment.   Vaccine hx reviewed.   S/p bilateral hernia  repair at [facility]    Awaiting other with ortho for left hip    Handicap papers filled out    Meds all renewed     Continue to fu with GI

## 2024-05-21 ENCOUNTER — Ambulatory Visit (INDEPENDENT_AMBULATORY_CARE_PROVIDER_SITE_OTHER): Payer: Self-pay | Admitting: Internal Medicine

## 2024-05-21 DIAGNOSIS — E785 Hyperlipidemia, unspecified: Secondary | ICD-10-CM

## 2024-05-21 LAB — CBC WITH DIFF
BASOPHIL #: 0.1 10*3/uL (ref 0.00–0.10)
BASOPHIL %: 1 % (ref 0–1)
EOSINOPHIL #: 0.8 10*3/uL — ABNORMAL HIGH (ref 0.00–0.60)
EOSINOPHIL %: 6 % (ref 1–8)
HCT: 51.9 % — ABNORMAL HIGH (ref 36.7–47.1)
HGB: 17.4 g/dL — ABNORMAL HIGH (ref 12.5–16.3)
LYMPHOCYTE #: 2 10*3/uL (ref 1.00–3.00)
LYMPHOCYTE %: 15 % (ref 15–43)
MCH: 30.8 pg (ref 23.8–33.4)
MCHC: 33.6 g/dL (ref 32.5–36.3)
MCV: 91.6 fL (ref 73.0–96.2)
MONOCYTE #: 1 10*3/uL (ref 0.30–1.10)
MONOCYTE %: 7 % (ref 6–14)
MPV: 8.8 fL (ref 7.4–11.4)
NEUTROPHIL #: 9.4 10*3/uL — ABNORMAL HIGH (ref 1.85–7.84)
NEUTROPHIL %: 71 % (ref 44–74)
PLATELETS: 310 10*3/uL (ref 140–440)
RBC: 5.67 10*6/uL — ABNORMAL HIGH (ref 4.06–5.63)
RDW: 12.7 % (ref 12.1–16.2)
WBC: 13.2 10*3/uL — ABNORMAL HIGH (ref 3.6–10.2)

## 2024-05-21 LAB — COMPREHENSIVE METABOLIC PNL, FASTING
ALBUMIN/GLOBULIN RATIO: 2.2 — ABNORMAL HIGH (ref 0.8–1.4)
ALBUMIN: 5.1 g/dL (ref 3.5–5.7)
ALKALINE PHOSPHATASE: 63 U/L (ref 34–104)
ALT (SGPT): 44 U/L (ref 7–52)
ANION GAP: 9 mmol/L (ref 4–13)
AST (SGOT): 28 U/L (ref 13–39)
BILIRUBIN TOTAL: 0.3 mg/dL (ref 0.3–1.0)
BUN/CREA RATIO: 15 (ref 6–22)
BUN: 12 mg/dL (ref 7–25)
CALCIUM, CORRECTED: 9.1 mg/dL (ref 8.9–10.8)
CALCIUM: 10 mg/dL (ref 8.6–10.3)
CHLORIDE: 106 mmol/L (ref 98–107)
CO2 TOTAL: 25 mmol/L (ref 21–31)
CREATININE: 0.78 mg/dL (ref 0.60–1.30)
ESTIMATED GFR: 119 mL/min/{1.73_m2} (ref >59)
GLOBULIN: 2.3 (ref 2.0–3.5)
GLUCOSE: 92 mg/dL (ref 74–109)
OSMOLALITY, CALCULATED: 279 mosm/kg (ref 270–290)
POTASSIUM: 4.2 mmol/L (ref 3.5–5.1)
PROTEIN TOTAL: 7.4 g/dL (ref 6.4–8.9)
SODIUM: 140 mmol/L (ref 136–145)

## 2024-05-21 LAB — LIPID PANEL
CHOL/HDL RATIO: 4.5
CHOLESTEROL: 211 mg/dL — ABNORMAL HIGH (ref <200)
HDL CHOL: 47 mg/dL (ref >=40)
LDL CALC: 138 mg/dL — ABNORMAL HIGH (ref 0–100)
TRIGLYCERIDES: 128 mg/dL (ref <=150)
VLDL CALC: 26 mg/dL (ref 0–50)

## 2024-05-21 LAB — THYROID STIMULATING HORMONE (SENSITIVE TSH): TSH: 1.246 u[IU]/mL (ref 0.450–5.330)

## 2024-05-23 MED ORDER — ATORVASTATIN 40 MG TABLET: 40.0000 mg | ORAL_TABLET | Freq: Every day | ORAL | 2 refills | Status: DC

## 2024-06-22 ENCOUNTER — Emergency Department (HOSPITAL_BASED_OUTPATIENT_CLINIC_OR_DEPARTMENT_OTHER)

## 2024-06-22 ENCOUNTER — Other Ambulatory Visit: Payer: Self-pay

## 2024-06-22 ENCOUNTER — Encounter (HOSPITAL_BASED_OUTPATIENT_CLINIC_OR_DEPARTMENT_OTHER): Payer: Self-pay

## 2024-06-22 ENCOUNTER — Emergency Department
Admission: EM | Admit: 2024-06-22 | Discharge: 2024-06-22 | Disposition: A | Discharge status: Home / Self Care | Attending: Emergency Medicine | Admitting: Emergency Medicine

## 2024-06-22 DIAGNOSIS — N132 Hydronephrosis with renal and ureteral calculous obstruction: Secondary | ICD-10-CM | POA: Insufficient documentation

## 2024-06-22 DIAGNOSIS — N200 Calculus of kidney: Secondary | ICD-10-CM

## 2024-06-22 DIAGNOSIS — R112 Nausea with vomiting, unspecified: Secondary | ICD-10-CM | POA: Insufficient documentation

## 2024-06-22 DIAGNOSIS — Z87442 Personal history of urinary calculi: Secondary | ICD-10-CM | POA: Insufficient documentation

## 2024-06-22 DIAGNOSIS — N23 Unspecified renal colic: Secondary | ICD-10-CM

## 2024-06-22 LAB — CBC WITH DIFF
BASOPHIL #: 0.04 x10ˆ3/uL (ref 0.00–0.10)
BASOPHIL %: 0 % (ref 0–1)
EOSINOPHIL #: 0.28 x10ˆ3/uL (ref 0.00–0.60)
EOSINOPHIL %: 2 % (ref 1–8)
HCT: 49.3 % — ABNORMAL HIGH (ref 36.7–47.1)
HGB: 16.7 g/dL — ABNORMAL HIGH (ref 12.5–16.3)
LYMPHOCYTE #: 2.05 x10ˆ3/uL (ref 1.00–3.00)
LYMPHOCYTE %: 16 % (ref 15–43)
MCH: 31.8 pg (ref 23.8–33.4)
MCHC: 33.8 g/dL (ref 32.5–36.3)
MCV: 94 fL (ref 73.0–96.2)
MONOCYTE #: 0.99 x10ˆ3/uL (ref 0.30–1.00)
MONOCYTE %: 8 % (ref 6–14)
MPV: 7.5 fL (ref 7.4–11.4)
NEUTROPHIL #: 9.39 x10ˆ3/uL — ABNORMAL HIGH (ref 1.85–7.84)
NEUTROPHIL %: 74 % (ref 44–74)
PLATELETS: 264 x10ˆ3/uL (ref 140–440)
RBC: 5.24 x10ˆ6/uL (ref 4.06–5.63)
RDW: 15.3 % (ref 12.1–16.2)
WBC: 12.8 x10ˆ3/uL — ABNORMAL HIGH (ref 3.6–10.2)

## 2024-06-22 LAB — COMPREHENSIVE METABOLIC PANEL, NON-FASTING
ALBUMIN/GLOBULIN RATIO: 1.7 — ABNORMAL HIGH (ref 0.8–1.4)
ALBUMIN: 4.4 g/dL (ref 3.4–5.0)
ALKALINE PHOSPHATASE: 77 U/L (ref 46–116)
ALT (SGPT): 43 U/L (ref <=78)
ANION GAP: 10 mmol/L (ref 4–13)
AST (SGOT): 18 U/L (ref 15–37)
BILIRUBIN TOTAL: 0.5 mg/dL (ref 0.2–1.0)
BUN/CREA RATIO: 17
BUN: 15 mg/dL (ref 7–18)
CALCIUM, CORRECTED: 8.8 mg/dL
CALCIUM: 9.1 mg/dL (ref 8.5–10.1)
CHLORIDE: 104 mmol/L (ref 98–107)
CO2 TOTAL: 24 mmol/L (ref 21–32)
CREATININE: 0.86 mg/dL (ref 0.70–1.30)
ESTIMATED GFR: 115 mL/min/1.73mˆ2 (ref >59)
GLOBULIN: 2.6
GLUCOSE: 117 mg/dL — ABNORMAL HIGH (ref 74–106)
OSMOLALITY, CALCULATED: 278 mosm/kg (ref 270–290)
POTASSIUM: 3.7 mmol/L (ref 3.5–5.1)
PROTEIN TOTAL: 7 g/dL (ref 6.4–8.2)
SODIUM: 138 mmol/L (ref 136–145)

## 2024-06-22 LAB — URINALYSIS, MACRO/MICRO
BILIRUBIN: NEGATIVE mg/dL
GLUCOSE: NEGATIVE mg/dL
KETONES: NEGATIVE mg/dL
LEUKOCYTES: NEGATIVE WBCs/uL
NITRITE: NEGATIVE
PH: 6 (ref 4.6–8.0)
PROTEIN: 100 mg/dL — AB
SPECIFIC GRAVITY: >=1.03 (ref 1.003–1.035)
UROBILINOGEN: 0.2 mg/dL (ref 0.2–1.0)

## 2024-06-22 LAB — URINALYSIS, MICROSCOPIC

## 2024-06-22 LAB — PT/INR
INR: 1.03 (ref 0.84–1.10)
PROTHROMBIN TIME: 11.6 s (ref 9.8–12.7)

## 2024-06-22 LAB — LIPASE: LIPASE: 40 U/L (ref 16–77)

## 2024-06-22 LAB — PTT (PARTIAL THROMBOPLASTIN TIME): APTT: 35.7 s (ref 25.0–38.0)

## 2024-06-22 MED ORDER — PHENAZOPYRIDINE 100 MG TABLET
ORAL_TABLET | ORAL | Status: AC
Start: 2024-06-22 — End: 2024-06-22
  Filled 2024-06-22: qty 2

## 2024-06-22 MED ORDER — TAMSULOSIN 0.4 MG CAPSULE
ORAL_CAPSULE | ORAL | Status: AC
Start: 2024-06-22 — End: 2024-06-22
  Filled 2024-06-22: qty 1

## 2024-06-22 MED ORDER — PHENAZOPYRIDINE 100 MG TABLET
200.0000 mg | ORAL_TABLET | ORAL | Status: AC
Start: 2024-06-22 — End: 2024-06-22
  Administered 2024-06-22: 200 mg via ORAL

## 2024-06-22 MED ORDER — TRAMADOL 50 MG TABLET
100.0000 mg | ORAL_TABLET | ORAL | Status: AC
Start: 2024-06-22 — End: 2024-06-22
  Administered 2024-06-22: 100 mg via ORAL

## 2024-06-22 MED ORDER — ONDANSETRON HCL (PF) 4 MG/2 ML INJECTION SOLUTION
INTRAMUSCULAR | Status: AC
Start: 2024-06-22 — End: 2024-06-22
  Filled 2024-06-22: qty 2

## 2024-06-22 MED ORDER — PHENAZOPYRIDINE 200 MG TABLET
200.0000 mg | ORAL_TABLET | Freq: Three times a day (TID) | ORAL | 0 refills | Status: AC
Start: 2024-06-22 — End: 2024-06-25

## 2024-06-22 MED ORDER — SULFAMETHOXAZOLE 800 MG-TRIMETHOPRIM 160 MG TABLET
1.0000 | ORAL_TABLET | ORAL | Status: DC
Start: 2024-06-22 — End: 2024-06-22
  Administered 2024-06-22: 0 mg via ORAL

## 2024-06-22 MED ORDER — KETOROLAC 10 MG TABLET
10.0000 mg | ORAL_TABLET | Freq: Four times a day (QID) | ORAL | 0 refills | Status: AC | PRN
Start: 2024-06-22 — End: ?

## 2024-06-22 MED ORDER — NITROFURANTOIN MONOHYDRATE/MACROCRYSTALS 100 MG CAPSULE
100.0000 mg | ORAL_CAPSULE | Freq: Two times a day (BID) | ORAL | Status: DC
Start: 2024-06-22 — End: 2024-06-22
  Administered 2024-06-22: 100 mg via ORAL

## 2024-06-22 MED ORDER — NITROFURANTOIN MONOHYDRATE/MACROCRYSTALS 100 MG CAPSULE
100.0000 mg | ORAL_CAPSULE | Freq: Two times a day (BID) | ORAL | 0 refills | Status: AC
Start: 2024-06-22 — End: 2024-06-29

## 2024-06-22 MED ORDER — ONDANSETRON 4 MG DISINTEGRATING TABLET
4.0000 mg | ORAL_TABLET | Freq: Three times a day (TID) | ORAL | 0 refills | Status: AC | PRN
Start: 2024-06-22 — End: ?

## 2024-06-22 MED ORDER — KETOROLAC 30 MG/ML (1 ML) INJECTION SOLUTION
INTRAMUSCULAR | Status: AC
Start: 2024-06-22 — End: 2024-06-22
  Filled 2024-06-22: qty 1

## 2024-06-22 MED ORDER — NITROFURANTOIN MONOHYDRATE/MACROCRYSTALS 100 MG CAPSULE
ORAL_CAPSULE | ORAL | Status: AC
Start: 2024-06-22 — End: 2024-06-22
  Filled 2024-06-22: qty 1

## 2024-06-22 MED ORDER — TAMSULOSIN 0.4 MG CAPSULE
0.4000 mg | ORAL_CAPSULE | Freq: Every evening | ORAL | Status: DC
Start: 2024-06-22 — End: 2024-06-22
  Administered 2024-06-22: 0.4 mg via ORAL

## 2024-06-22 MED ORDER — KETOROLAC 30 MG/ML (1 ML) INJECTION SOLUTION
30.0000 mg | INTRAMUSCULAR | Status: AC
Start: 2024-06-22 — End: 2024-06-22
  Administered 2024-06-22: 30 mg via INTRAVENOUS

## 2024-06-22 MED ORDER — SODIUM CHLORIDE 0.9 % IV BOLUS
1000.0000 mL | INJECTION | Status: AC
Start: 2024-06-22 — End: 2024-06-22
  Administered 2024-06-22: 0 mL via INTRAVENOUS
  Administered 2024-06-22: 1000 mL via INTRAVENOUS

## 2024-06-22 MED ORDER — TAMSULOSIN 0.4 MG CAPSULE
0.4000 mg | ORAL_CAPSULE | Freq: Every evening | ORAL | 0 refills | Status: AC
Start: 2024-06-22 — End: 2024-07-22

## 2024-06-22 MED ORDER — ONDANSETRON HCL (PF) 4 MG/2 ML INJECTION SOLUTION
4.0000 mg | INTRAMUSCULAR | Status: AC
Start: 2024-06-22 — End: 2024-06-22
  Administered 2024-06-22: 4 mg via INTRAVENOUS

## 2024-06-22 MED ORDER — SULFAMETHOXAZOLE 800 MG-TRIMETHOPRIM 160 MG TABLET
ORAL_TABLET | ORAL | Status: AC
Start: 2024-06-22 — End: 2024-06-22
  Filled 2024-06-22: qty 1

## 2024-06-22 MED ORDER — TRAMADOL 50 MG TABLET
ORAL_TABLET | ORAL | Status: AC
Start: 2024-06-22 — End: 2024-06-22
  Filled 2024-06-22: qty 2

## 2024-06-22 NOTE — ED Provider Notes (Signed)
 [facility] - Emergency Department  ED Primary Provider Note  History of Present Illness   Chief Complaint   Patient presents with    Flank Pain     Patient c/o right flank pain and nausea since he awoke this morning. Hx of kidney stones     Andrew Collier is a 38 y.o. male who had concerns including Flank Pain.  Arrival: The patient arrived by Car complaining right flank pain radiating around the abdomen to his suprapubic area.  He denies any pain in his testicle at this point.  He is complaining hematuria.  He is also complaining of increased frequency.  Denies any dysuria.  He has had history of kidney stones in the past and states this feels very similar to his stones.  He also has vomited several times this morning because of the pain.    HPI  Review of Systems   Review of Systems   Constitutional:  Positive for activity change and appetite change. Negative for chills and fever.   HENT:  Negative for ear pain and sore throat.    Eyes:  Negative for pain and visual disturbance.   Respiratory:  Negative for cough and shortness of breath.    Cardiovascular:  Negative for chest pain and palpitations.   Gastrointestinal:  Positive for abdominal pain, nausea and vomiting.   Genitourinary:  Positive for flank pain, frequency and urgency. Negative for dysuria and hematuria.   Musculoskeletal:  Negative for arthralgias and back pain.   Skin:  Negative for color change and rash.   Neurological:  Negative for seizures and syncope.   All other systems reviewed and are negative.     Historical Data   History Reviewed This Encounter:     Physical Exam   ED Triage Vitals   BP (Non-Invasive) 06/22/24 1038 (!) 158/116   Heart Rate 06/22/24 1038 64   Respiratory Rate 06/22/24 1038 20   Temperature 06/22/24 1041 36.3 C (97.3 F)   SpO2 06/22/24 1038 94 %   Weight 06/22/24 1038 90.7 kg (200 lb)   Height 06/22/24 1038 1.651 m (5' 5)     Physical Exam  Vitals and nursing note reviewed.    Constitutional:       General: He is not in acute distress.     Appearance: Normal appearance. He is well-developed. He is obese.   HENT:      Head: Normocephalic and atraumatic.      Right Ear: External ear normal.      Left Ear: External ear normal.      Nose: Nose normal.      Mouth/Throat:      Mouth: Mucous membranes are dry.   Eyes:      Extraocular Movements: Extraocular movements intact.      Conjunctiva/sclera: Conjunctivae normal.      Pupils: Pupils are equal, round, and reactive to light.   Cardiovascular:      Rate and Rhythm: Normal rate and regular rhythm.      Pulses: Normal pulses.      Heart sounds: Normal heart sounds. No murmur heard.  Pulmonary:      Effort: Pulmonary effort is normal. No respiratory distress.      Breath sounds: Normal breath sounds.   Abdominal:      General: Bowel sounds are normal.      Palpations: Abdomen is soft.      Tenderness: There is abdominal tenderness. There is right CVA tenderness.  Comments: Positive tenderness right CVA.  Positive tenderness right suprapubic area.   Musculoskeletal:         General: No swelling. Normal range of motion.      Cervical back: Normal range of motion and neck supple.   Skin:     General: Skin is warm and dry.      Capillary Refill: Capillary refill takes less than 2 seconds.   Neurological:      General: No focal deficit present.      Mental Status: He is alert and oriented to person, place, and time.   Psychiatric:         Mood and Affect: Mood normal.         Behavior: Behavior normal.         Thought Content: Thought content normal.       Patient Data     Labs Ordered/Reviewed   COMPREHENSIVE METABOLIC PANEL, NON-FASTING - Abnormal; Notable for the following components:       Result Value    GLUCOSE 117 (*)     ALBUMIN/GLOBULIN RATIO 1.7 (*)     All other components within normal limits    Narrative:     Estimated Glomerular Filtration Rate (eGFR) is calculated using the CKD-EPI (2021) equation, intended for patients 18 years  of age and older. If gender is not documented or unknown, there will be no eGFR calculation.   CBC WITH DIFF - Abnormal; Notable for the following components:    WBC 12.8 (*)     HGB 16.7 (*)     HCT 49.3 (*)     NEUTROPHIL # 9.39 (*)     All other components within normal limits   URINALYSIS, MACRO/MICRO - Abnormal; Notable for the following components:    PROTEIN 100 (*)     BLOOD Large (*)     All other components within normal limits   URINALYSIS, MICROSCOPIC - Abnormal; Notable for the following components:    RBCS 20-50 (*)     BACTERIA Few (*)     AMORPHOUS SEDIMENT Few (*)     All other components within normal limits   LIPASE - Normal   PT/INR - Normal    Narrative:     In the setting of warfarin therapy, a moderate-intensity INR goal range is 2.0 to 3.0 and a high-intensity INR goal range is 2.5 to 3.5.    INR is ONLY validated to determine the level of anticoagulation with vitamin K antagonists (warfarin). Other factors may elevate the INR including but not limited to direct oral anticoagulants (DOACs), liver dysfunction, vitamin K deficiency, DIC, factor deficiencies, and factor inhibitors.   PTT (PARTIAL THROMBOPLASTIN TIME) - Normal   CBC/DIFF    Narrative:     The following orders were created for panel order CBC/DIFF.  Procedure                               Abnormality         Status                     ---------                               -----------         ------  CBC WITH IPQQ[[id number]]                Abnormal            Final result                 Please view results for these tests on the individual orders.   URINALYSIS WITH REFLEX MICROSCOPIC AND CULTURE IF POSITIVE    Narrative:     The following orders were created for panel order URINALYSIS WITH REFLEX MICROSCOPIC AND CULTURE IF POSITIVE.  Procedure                               Abnormality         Status                     ---------                               -----------         ------                      URINALYSIS, MACRO/MICRO[[id number]]      Abnormal            Final result               URINALYSIS, MICROSCOPIC[[id number]]      Abnormal            Final result                 Please view results for these tests on the individual orders.     CT ABDOMEN PELVIS WO IV CONTRAST   Final Result by Edi, Radresults In (07/27 1113)   Mild to moderate right hydronephrosis with 4 mm calculus in the proximal right ureter.               Radiologist location ID: [id number]           Medical Decision Making        Medical Decision Making  Patient is 38 year old white male complaining right CVA tenderness wrapping around his abdomen into his suprapubic area this morning when he 1st awakened.  Patient has had 2 bouts of nausea vomiting because of the pain.  He is also complaining of hematuria and increased frequency.  He denies any dysuria.  Patient denies any fever chills.  Patient will have an IV placed for hydration as well as labs and a CT of his abdomen pelvis.  He will be given some for pain as well as nausea.  Patient will be treated for results.  Patient will then be discharged home to follow up with PMD in the next 2-3 days.    Amount and/or Complexity of Data Reviewed  Labs: ordered.  Radiology: ordered.    Risk  Prescription drug management.             Medications Ordered/Administered in the ED   tamsulosin  (FLOMAX ) capsule (0.4 mg Oral Given 06/22/24 1231)   trimethoprim -sulfamethoxazole  (BACTRIM  DS) 160-800mg  per tablet (0 mg Oral Not Given 06/22/24 1200)   nitrofurantoin  monohydrate macrocrystal (MACROBID ) capsule (has no administration in time range)   NS bolus infusion 1,000 mL (1,000 mL Intravenous New Bag/New Syringe 06/22/24 1130)   ketorolac  (TORADOL ) 30 mg/mL injection (30 mg Intravenous Given 06/22/24 1130)   ondansetron  (ZOFRAN )  2 mg/mL injection (4 mg Intravenous Given 06/22/24 1132)   phenazopyridine  (PYRIDIUM ) tablet (200 mg Oral Given 06/22/24 1230)   traMADol  (ULTRAM ) tablet (100 mg Oral Given 06/22/24  1229)     Clinical Impression   Right kidney stone (Primary)   Renal colic on right side   Nausea & vomiting       Disposition: Discharged               Clinical Impression   Right kidney stone (Primary)   Renal colic on right side   Nausea & vomiting       Current Discharge Medication List        START taking these medications    Details   ketorolac  tromethamine  (TORADOL ) 10 mg Oral Tablet Take 1 Tablet (10 mg total) by mouth Every 6 hours as needed for Pain  Qty: 20 Tablet, Refills: 0      nitrofurantoin  monohyd/m-cryst (MACROBID ) 100 mg Oral Capsule Take 1 Capsule (100 mg total) by mouth Twice daily for 7 days  Qty: 14 Capsule, Refills: 0      phenazopyridine  (PYRIDIUM ) 200 mg Oral Tablet Take 1 Tablet (200 mg total) by mouth Three times a day for 3 days  Qty: 9 Tablet, Refills: 0      tamsulosin  (FLOMAX ) 0.4 mg Oral Capsule Take 1 Capsule (0.4 mg total) by mouth Every evening after dinner for 30 days  Qty: 30 Capsule, Refills: 0

## 2024-06-22 NOTE — ED Nurses Note (Signed)
 Pain 9/10 Decreased nausea . Pt no longer moving about in bed . No longer moaning.

## 2024-06-22 NOTE — ED Notes (Addendum)
 Pt awake alert. Moaning moving about in bed. c/os right flank pain radiating to right mid abdomen. 10/10 HX of kidney stones.

## 2024-06-22 NOTE — ED Nurses Note (Signed)
 Pt refuses Bactrim . States, It makes me vomit even when I eat or take Zofran .  Dr. Eilene informed . New order noted.

## 2024-06-22 NOTE — ED Nurses Note (Signed)
 Pt states, I am feeling some better now. Pain decreased 7/10. No vomiting. No nausea. Tolerating water without difficulty. Prescriptions x 5 e-scribed. Verbal and written instructions given. Pt and wife voice understanding. Disc of CT sent with Pt. Pt DC home ambulatory with wife.

## 2024-06-23 ENCOUNTER — Telehealth (HOSPITAL_COMMUNITY): Payer: Self-pay | Admitting: Internal Medicine

## 2024-06-23 NOTE — Telephone Encounter (Signed)
 Post Ed Follow-Up    Post ED Follow-Up:   Document completed and/or attempted interactive contact(s) after transition to home after emergency department stay.:   Transition Facility and relevant Date:   Discharge Date: 06/22/24  Discharge from [facility] Emergency Department?: Yes  Discharge Facility: [facility]  Contacted by: Jackson Lessen, RN  Contact method: Patient/Caregiver Telephone, MyChart Patient Portal  Contact completed: 06/23/2024  3:00 PM  MyChart message sent?: Yes  Was the AVS reviewed with patient?: Yes  Did the patient attempt to reach their PCP prior to going to the ED?: No  How is the patient recovering?: Improving  Medications prescribed: Yes  Were they obtained?: Yes  Interventions: Provided patient education  Follow Up Visit:  (Comment: Advised to follow up with PCP as directed. FLS, RN)  Jensyn denies questions/concerns. Rx's have been obtained. Education provided on medications, hydration, drinks to avoid, follow up care, options for non-emergent questions/concerns/sx, and return to ED precautions. 24/7 Nurse Navigator toll free number, resources available, and hours of operation provided to patient and/or caregiver. Jackson Lessen, RN         Jackson Lessen, RN

## 2024-06-24 ENCOUNTER — Telehealth (INDEPENDENT_AMBULATORY_CARE_PROVIDER_SITE_OTHER): Payer: Self-pay | Admitting: Internal Medicine

## 2024-06-24 ENCOUNTER — Encounter (HOSPITAL_COMMUNITY): Payer: Self-pay

## 2024-06-24 ENCOUNTER — Other Ambulatory Visit: Payer: Self-pay

## 2024-06-24 ENCOUNTER — Inpatient Hospital Stay
Admission: EM | Admit: 2024-06-24 | Discharge: 2024-06-25 | DRG: 694 | Disposition: A | Discharge status: Home / Self Care | Source: Home / Self Care

## 2024-06-24 ENCOUNTER — Ambulatory Visit: Attending: Internal Medicine | Admitting: Internal Medicine

## 2024-06-24 ENCOUNTER — Emergency Department (HOSPITAL_COMMUNITY)

## 2024-06-24 ENCOUNTER — Encounter (INDEPENDENT_AMBULATORY_CARE_PROVIDER_SITE_OTHER): Payer: Self-pay | Admitting: Internal Medicine

## 2024-06-24 DIAGNOSIS — R569 Unspecified convulsions: Secondary | ICD-10-CM | POA: Diagnosis present

## 2024-06-24 DIAGNOSIS — N132 Hydronephrosis with renal and ureteral calculous obstruction: Principal | ICD-10-CM | POA: Diagnosis present

## 2024-06-24 DIAGNOSIS — I10 Essential (primary) hypertension: Secondary | ICD-10-CM | POA: Diagnosis present

## 2024-06-24 DIAGNOSIS — Z79899 Other long term (current) drug therapy: Secondary | ICD-10-CM

## 2024-06-24 DIAGNOSIS — N1330 Unspecified hydronephrosis: Secondary | ICD-10-CM

## 2024-06-24 DIAGNOSIS — F419 Anxiety disorder, unspecified: Secondary | ICD-10-CM | POA: Diagnosis present

## 2024-06-24 DIAGNOSIS — E785 Hyperlipidemia, unspecified: Secondary | ICD-10-CM | POA: Diagnosis present

## 2024-06-24 DIAGNOSIS — N200 Calculus of kidney: Secondary | ICD-10-CM

## 2024-06-24 DIAGNOSIS — M1990 Unspecified osteoarthritis, unspecified site: Secondary | ICD-10-CM | POA: Diagnosis present

## 2024-06-24 DIAGNOSIS — Z9049 Acquired absence of other specified parts of digestive tract: Secondary | ICD-10-CM

## 2024-06-24 DIAGNOSIS — E669 Obesity, unspecified: Secondary | ICD-10-CM | POA: Diagnosis present

## 2024-06-24 DIAGNOSIS — N201 Calculus of ureter: Principal | ICD-10-CM

## 2024-06-24 DIAGNOSIS — Z87442 Personal history of urinary calculi: Secondary | ICD-10-CM

## 2024-06-24 HISTORY — DX: Celiac disease: K90.0

## 2024-06-24 LAB — COMPREHENSIVE METABOLIC PANEL, NON-FASTING
ALBUMIN/GLOBULIN RATIO: 1.8 — ABNORMAL HIGH (ref 0.8–1.4)
ALBUMIN: 4.6 g/dL (ref 3.5–5.7)
ALKALINE PHOSPHATASE: 64 U/L (ref 34–104)
ALT (SGPT): 30 U/L (ref 7–52)
ANION GAP: 8 mmol/L (ref 4–13)
AST (SGOT): 19 U/L (ref 13–39)
BILIRUBIN TOTAL: 0.3 mg/dL (ref 0.3–1.0)
BUN/CREA RATIO: 11 (ref 6–22)
BUN: 10 mg/dL (ref 7–25)
CALCIUM, CORRECTED: 8.9 mg/dL (ref 8.9–10.8)
CALCIUM: 9.4 mg/dL (ref 8.6–10.3)
CHLORIDE: 107 mmol/L (ref 98–107)
CO2 TOTAL: 26 mmol/L (ref 21–31)
CREATININE: 0.89 mg/dL (ref 0.60–1.30)
ESTIMATED GFR: 114 mL/min/1.73mˆ2 (ref >59)
GLOBULIN: 2.5 (ref 2.0–3.5)
GLUCOSE: 101 mg/dL (ref 74–109)
OSMOLALITY, CALCULATED: 281 mosm/kg (ref 270–290)
POTASSIUM: 4.1 mmol/L (ref 3.5–5.1)
PROTEIN TOTAL: 7.1 g/dL (ref 6.4–8.9)
SODIUM: 141 mmol/L (ref 136–145)

## 2024-06-24 LAB — CBC WITH DIFF
BASOPHIL #: 0.1 x10ˆ3/uL (ref 0.00–0.10)
BASOPHIL %: 1 % (ref 0–1)
EOSINOPHIL #: 0.3 x10ˆ3/uL (ref 0.00–0.60)
EOSINOPHIL %: 3 % (ref 1–8)
HCT: 47.9 % — ABNORMAL HIGH (ref 36.7–47.1)
HGB: 16.4 g/dL — ABNORMAL HIGH (ref 12.5–16.3)
LYMPHOCYTE #: 2.2 x10ˆ3/uL (ref 1.00–3.00)
LYMPHOCYTE %: 20 % (ref 15–43)
MCH: 31 pg (ref 23.8–33.4)
MCHC: 34.4 g/dL (ref 32.5–36.3)
MCV: 90.2 fL (ref 73.0–96.2)
MONOCYTE #: 1.3 x10ˆ3/uL — ABNORMAL HIGH (ref 0.30–1.10)
MONOCYTE %: 12 % (ref 6–14)
MPV: 7.8 fL (ref 7.4–11.4)
NEUTROPHIL #: 7 x10ˆ3/uL (ref 1.85–7.84)
NEUTROPHIL %: 64 % (ref 44–74)
PLATELETS: 275 x10ˆ3/uL (ref 140–440)
RBC: 5.31 x10ˆ6/uL (ref 4.06–5.63)
RDW: 13.4 % (ref 12.1–16.2)
WBC: 10.9 x10ˆ3/uL — ABNORMAL HIGH (ref 3.6–10.2)

## 2024-06-24 LAB — URINALYSIS, MACROSCOPIC
BILIRUBIN: NEGATIVE mg/dL
BLOOD: 1 mg/dL — AB
GLUCOSE: NEGATIVE mg/dL
KETONES: NEGATIVE mg/dL
LEUKOCYTES: NEGATIVE WBCs/uL
PH: 6 (ref 5.0–9.0)
PROTEIN: NEGATIVE mg/dL
SPECIFIC GRAVITY: 1.008 (ref 1.002–1.030)
UROBILINOGEN: NORMAL mg/dL

## 2024-06-24 LAB — LACTIC ACID LEVEL W/ REFLEX FOR LEVEL >2.0: LACTIC ACID: 1 mmol/L (ref 0.5–2.2)

## 2024-06-24 LAB — URINALYSIS, MICROSCOPIC
BACTERIA: NEGATIVE /HPF
RBCS: 11 /HPF — ABNORMAL HIGH (ref <4)
WBCS: 14 /HPF — ABNORMAL HIGH (ref <6)

## 2024-06-24 MED ORDER — ONDANSETRON HCL (PF) 4 MG/2 ML INJECTION SOLUTION
4.0000 mg | Freq: Three times a day (TID) | INTRAMUSCULAR | Status: DC | PRN
Start: 2024-06-24 — End: 2024-06-25

## 2024-06-24 MED ORDER — ONDANSETRON HCL (PF) 4 MG/2 ML INJECTION SOLUTION
INTRAMUSCULAR | Status: AC
Start: 2024-06-24 — End: 2024-06-24
  Filled 2024-06-24: qty 2

## 2024-06-24 MED ORDER — TAMSULOSIN 0.4 MG CAPSULE
0.4000 mg | ORAL_CAPSULE | Freq: Every evening | ORAL | Status: DC
Start: 2024-06-24 — End: 2024-06-25
  Administered 2024-06-24: 0.4 mg via ORAL
  Filled 2024-06-24: qty 1

## 2024-06-24 MED ORDER — TAMSULOSIN 0.4 MG CAPSULE
ORAL_CAPSULE | ORAL | Status: AC
Start: 2024-06-24 — End: 2024-06-24
  Filled 2024-06-24: qty 1

## 2024-06-24 MED ORDER — SODIUM CHLORIDE 0.9 % INTRAVENOUS SOLUTION
INTRAVENOUS | Status: AC
Start: 2024-06-24 — End: 2024-06-25
  Administered 2024-06-25: 0 mL via INTRAVENOUS

## 2024-06-24 MED ORDER — KETOROLAC 30 MG/ML (1 ML) INJECTION SOLUTION
30.0000 mg | INTRAMUSCULAR | Status: AC
Start: 2024-06-24 — End: 2024-06-24
  Administered 2024-06-24: 30 mg via INTRAVENOUS

## 2024-06-24 MED ORDER — LAMOTRIGINE 100 MG TABLET
200.0000 mg | ORAL_TABLET | Freq: Two times a day (BID) | ORAL | Status: DC
Start: 2024-06-24 — End: 2024-06-25
  Administered 2024-06-24 – 2024-06-25 (×2): 0 mg via ORAL
  Filled 2024-06-24: qty 2

## 2024-06-24 MED ORDER — KETOROLAC 30 MG/ML (1 ML) INJECTION SOLUTION
INTRAMUSCULAR | Status: AC
Start: 2024-06-24 — End: 2024-06-24
  Filled 2024-06-24: qty 1

## 2024-06-24 MED ORDER — SODIUM CHLORIDE 0.9 % IV BOLUS
1000.0000 mL | INJECTION | Status: AC
Start: 2024-06-24 — End: 2024-06-24
  Administered 2024-06-24: 1000 mL via INTRAVENOUS
  Administered 2024-06-24: 0 mL via INTRAVENOUS

## 2024-06-24 MED ORDER — MONTELUKAST 10 MG TABLET
10.0000 mg | ORAL_TABLET | Freq: Every evening | ORAL | Status: DC
Start: 2024-06-24 — End: 2024-06-25
  Administered 2024-06-24: 10 mg via ORAL
  Filled 2024-06-24: qty 1

## 2024-06-24 MED ORDER — FAMOTIDINE 40 MG TABLET
40.0000 mg | ORAL_TABLET | Freq: Two times a day (BID) | ORAL | Status: DC
Start: 2024-06-24 — End: 2024-06-25
  Administered 2024-06-24: 40 mg via ORAL
  Administered 2024-06-25: 0 mg via ORAL
  Filled 2024-06-24: qty 1

## 2024-06-24 MED ORDER — METOPROLOL SUCCINATE ER 25 MG TABLET,EXTENDED RELEASE 24 HR
25.0000 mg | ORAL_TABLET | Freq: Every day | ORAL | Status: DC
Start: 2024-06-24 — End: 2024-06-25
  Administered 2024-06-24: 0 mg via ORAL
  Administered 2024-06-25: 25 mg via ORAL
  Filled 2024-06-24: qty 1

## 2024-06-24 MED ORDER — LORATADINE 10 MG TABLET
10.0000 mg | ORAL_TABLET | Freq: Every day | ORAL | Status: DC
Start: 2024-06-24 — End: 2024-06-25
  Administered 2024-06-24 – 2024-06-25 (×2): 0 mg via ORAL

## 2024-06-24 MED ORDER — ATORVASTATIN 40 MG TABLET
40.0000 mg | ORAL_TABLET | Freq: Every day | ORAL | Status: DC
Start: 2024-06-24 — End: 2024-06-25
  Administered 2024-06-24: 40 mg via ORAL
  Filled 2024-06-24: qty 1

## 2024-06-24 MED ORDER — ONDANSETRON HCL (PF) 4 MG/2 ML INJECTION SOLUTION
4.0000 mg | INTRAMUSCULAR | Status: AC
Start: 2024-06-24 — End: 2024-06-24
  Administered 2024-06-24: 4 mg via INTRAVENOUS

## 2024-06-24 MED ORDER — KETOROLAC 30 MG/ML (1 ML) INJECTION SOLUTION
15.0000 mg | Freq: Four times a day (QID) | INTRAMUSCULAR | Status: DC | PRN
Start: 2024-06-24 — End: 2024-06-27

## 2024-06-24 NOTE — ED APP Handoff Note (Signed)
 [facility] - Emergency Department  Emergency Department  Provider in Triage Note    Name: Andrew Collier  Age: 38 y.o.  Gender: male     Subjective:   Andrew Collier is a 38 y.o. male who presents with complaint of Flank Pain  . Right flank pain that started on Sunday. Today much worse. Decreased urine output. Hx of lithotripsy. Nausea without vomiting. No blood thinners.    Objective:   Filed Vitals:    06/24/24 1255   BP: (!) 188/120   Pulse: 76   Resp: (!) 23   Temp: 36.3 C (97.4 F)   SpO2: 96%      Vitals are also documented in the EMR.    Focused Physical Exam shows appears to be in pain    Assessment:  A medical screening exam was completed.  This patient is a 38 y.o. male with Flank Pain  .    Plan:  Please see initial orders and work-up in the EMR.  This is to be continued with full evaluation in the main Emergency Department.  Eval at [facility] 7.27.25    ketorolac  (TORADOL ) 30 mg/mL injection, 30 mg, Intravenous, Now  NS bolus infusion 1,000 mL, 1,000 mL, Intravenous, Now  ondansetron  (ZOFRAN ) 2 mg/mL injection, 4 mg, Intravenous, Now       Results for orders placed or performed during the hospital encounter of 06/24/24 (from the past 24 hours)   CBC/DIFF    Collection Time: 06/24/24 12:59 PM    Narrative    The following orders were created for panel order CBC/DIFF.  Procedure                               Abnormality         Status                     ---------                               -----------         ------                     CBC WITH IPQQ[[id number]]                                                                 Please view results for these tests on the individual orders.   URINALYSIS, MACROSCOPIC AND MICROSCOPIC W/CULTURE REFLEX    Collection Time: 06/24/24 12:59 PM    Specimen: Urine, Clean Catch    Narrative    The following orders were created for panel order URINALYSIS, MACROSCOPIC AND MICROSCOPIC W/CULTURE REFLEX.  Procedure                                Abnormality         Status                     ---------                               -----------         ------  URINALYSIS, MACROSCOPIC[[id number]]                                                     URINALYSIS, MICROSCOPIC[[id number]]                                                       Please view results for these tests on the individual orders.          Lauraine LITTIE Crock PA-C, MPAS   06/24/2024, 12:55   Department of Emergency Medicine  [facility]

## 2024-06-24 NOTE — Consults (Signed)
 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 male who presents with right flank pain with ureteral stone and hydronephrosis.  The patient has a known history of urolithiasis requiring surgical intervention in the past.  Presented to Andrew Collier about 2 days ago with a flank pain and was found to have a 4 mm upper 3rd ureteral stone with hydronephrosis.  He is on successfully passed the stone and is still having increased pain which has not been able to be controlled as an outpatient.  He denies any fever, chills, nausea or vomiting.  He denies any gross hematuria or dysuria.    Past Medical History:   Diagnosis Date    Celiac disease     Circadian rhythm sleep disorder     Epilepsy     GAD (generalized anxiety disorder)     Osteoarthritis     Paroxysmal supraventricular tachycardia (CMS HCC)          Past Surgical History:   Procedure Laterality Date    BLADDER SURGERY N/A     kidney stones  stents    pervaiz    COLONOSCOPY N/A     01/21/2020  duremedes    HX APPENDECTOMY N/A     laparoscopic    HX CHOLECYSTECTOMY      [facility]es   2018    HX HERNIA REPAIR      bilateral   [facility]    HX HIP REPLACEMENT Left     2006  Dr  Nunzio FUJISAWA    [facility] SHOULDER SURGERY Left     tear   Dr Joesph JOY UPPER ENDOSCOPY N/A     01/21/2020  dr  Lynnette    HX VASECTOMY N/A     ORTHOPEDIC SURGERY Left     uva   left knee cartialge removedal hip 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    RENAL ARTERY STENT N/A     SEPTOPLASTY N/A     05/28/2015         Medications Prior to Admission       Prescriptions    atorvastatin  (LIPITOR) 40 mg Oral Tablet    Take 1 Tablet (40 mg total) by mouth Daily    Patient taking differently:  Take 0.5 Tablets (20 mg total) by mouth  Daily    dicyclomine  (BENTYL ) 20 mg Oral Tablet    Take 1 Tablet (20 mg total) by mouth Four times a day    Patient taking differently:  Take 1 Tablet (20 mg total) by mouth Four times a day as needed for Other    eluxadoline  (VIBERZI ) 100 mg Oral Tablet    Take 1 Tablet (100 mg total) by mouth Twice daily    famotidine  (PEPCID ) 40 mg Oral Tablet    Take 1 Tablet (40 mg total) by mouth Twice daily    gabapentin  (NEURONTIN ) 600 mg Oral Tablet    Take 1 Tablet (600 mg total) by mouth Three times a day    ketorolac  tromethamine  (TORADOL ) 10 mg Oral Tablet    Take 1 Tablet (10 mg total) by mouth Every 6 hours as  needed for Pain    lamoTRIgine  (LAMICTAL ) 200 mg Oral Tablet    Take 1 Tablet (200 mg total) by mouth Twice daily    Levocetirizine (XYZAL) 5 mg Oral Tablet    Take 1 Tablet (5 mg total) by mouth Every evening    LORazepam  (ATIVAN ) 0.5 mg Oral Tablet    Take 1 Tablet (0.5 mg total) by mouth Three times a day    meloxicam  (MOBIC ) 15 mg Oral Tablet    Take 1 Tablet (15 mg total) by mouth Daily    metoprolol  succinate (TOPROL -XL) 25 mg Oral Tablet Sustained Release 24 hr    Take 1 Tablet (25 mg total) by mouth Daily    montelukast  (SINGULAIR ) 10 mg Oral Tablet    Take 1 Tablet (10 mg total) by mouth Every evening for 90 days    nitrofurantoin  monohyd/m-cryst (MACROBID ) 100 mg Oral Capsule    Take 1 Capsule (100 mg total) by mouth Twice daily for 7 days    omeprazole  (PRILOSEC) 40 mg Oral Capsule, Delayed Release(E.C.)    Take 1 Capsule (40 mg total) by mouth Daily    ondansetron  (ZOFRAN  ODT) 4 mg Oral Tablet, Rapid Dissolve    Take 1 Tablet (4 mg total) by mouth Every 8 hours as needed for Nausea/Vomiting    phenazopyridine  (PYRIDIUM ) 200 mg Oral Tablet    Take 1 Tablet (200 mg total) by mouth Three times a day for 3 days    tamsulosin  (FLOMAX ) 0.4 mg Oral Capsule    Take 1 Capsule (0.4 mg total) by mouth Every evening after dinner for 30 days          Allergies[1]  Social History     Socioeconomic History     Marital status: Married   Tobacco Use    Smoking status: Never    Smokeless tobacco: Never   Vaping Use    Vaping status: Never Used   Substance and Sexual Activity    Alcohol use: Never    Drug use: Never     Social Determinants of Health     Financial Resource Strain: Low Risk  (03/11/2024)    Received from [facility]    Overall Financial Resource Strain (CARDIA)     Difficulty of Paying Living Expenses: Not hard at all   Transportation Needs: No Transportation Needs (03/11/2024)    Received from [facility] - Transportation     Lack of Transportation (Medical): No     Lack of Transportation (Non-Medical): No    Received from [facility]    Social Network   Intimate Partner Violence: Not At Risk (03/20/2024)    Received from [facility]    HITS     Over the last 12 months how often did your partner physically hurt you?: Never     Over the last 12 months how often did your partner insult you or talk down to you?: Never     Over the last 12 months how often did your partner threaten you with physical harm?: Never     Over the last 12 months how often did your partner scream or curse at you?: Never   Housing Stability: Low Risk  (03/11/2024)    Received from [facility] Stability Vital Sign     Unable to Pay for Housing in the Last Year: No     Number of Times Moved in the Last Year: 0     Homeless in  the Last Year: No     Family Medical History:       Problem Relation (Age of Onset)    Arthritis-rheumatoid Mother    Elevated Lipids Mother, Father    Hypertension (High Blood Pressure) Mother             Review of Systems:  All pertinent review of systems as address and detailed in HPI    Vital Signs:  Temperature: 36.3 C (97.4 F)  Heart Rate: 82  BP (Non-Invasive): (!) 153/99  Respiratory Rate: (!) 21  SpO2: 93 %    Exam:  Temperature: 36.3 C (97.4 F)  Heart Rate: 82  BP (Non-Invasive): (!) 153/99  Respiratory Rate: (!) 21  SpO2: 93 %  General: appears in good health,  comfortable  Eyes: Pupils equal and round, reactive to light and accomodation. , Conjunctivae/corneas clear, EOM's intact, no nystagmus.   HENT: Normocephalic, atraumatic.  Nose without erythema, polyps or rhinorrhea.  Mouth mucous membranes moist , Pharynx without injection or exudate, no oral lesions.   Neck: supple, no lymphadenopathy   Lungs: clear to auscultation bilaterally.   Cardiovascular:    Heart regular rate and rhythm, no murmur, click, rub or gallop  Abdomen: soft, non-tender, non-distended, no organomegaly, no masses and no hernias  GU Exam no costovertebral angle tenderness  Extremities: no edema, redness or tenderness in the calves or thighs, no varicosities  Skin: Skin color, texture, turgor normal. No rashes or lesions  Neurologic: gait is normal, DTR intact bilaterally.   Alert and oriented x3  Lymphatics: no lymphadenopathy  Psychiatric: affect normal, behavior normal, thought content normal, judgement normal     LABS:  I have reviewed all lab results.  Lab Results Today:    Results for orders placed or performed during the hospital encounter of 06/24/24 (from the past 24 hours)   LACTIC ACID LEVEL W/ REFLEX FOR LEVEL >2.0   Result Value Ref Range    LACTIC ACID 1.0 0.5 - 2.2 mmol/L   CBC WITH DIFF   Result Value Ref Range    WBC 10.9 (H) 3.6 - 10.2 x10^3/uL    RBC 5.31 4.06 - 5.63 x10^6/uL    HGB 16.4 (H) 12.5 - 16.3 g/dL    HCT 52.0 (H) 63.2 - 47.1 %    MCV 90.2 73.0 - 96.2 fL    MCH 31.0 23.8 - 33.4 pg    MCHC 34.4 32.5 - 36.3 g/dL    RDW 86.5 87.8 - 83.7 %    PLATELETS 275 140 - 440 x10^3/uL    MPV 7.8 7.4 - 11.4 fL    NEUTROPHIL % 64 44 - 74 %    LYMPHOCYTE % 20 15 - 43 %    MONOCYTE % 12 6 - 14 %    EOSINOPHIL % 3 1 - 8 %    BASOPHIL % 1 0 - 1 %    NEUTROPHIL # 7.00 1.85 - 7.84 x10^3/uL    LYMPHOCYTE # 2.20 1.00 - 3.00 x10^3/uL    MONOCYTE # 1.30 (H) 0.30 - 1.10 x10^3/uL    EOSINOPHIL # 0.30 0.00 - 0.60 x10^3/uL    BASOPHIL # 0.10 0.00 - 0.10 x10^3/uL   COMPREHENSIVE METABOLIC PANEL,  NON-FASTING   Result Value Ref Range    SODIUM 141 136 - 145 mmol/L    POTASSIUM 4.1 3.5 - 5.1 mmol/L    CHLORIDE 107 98 - 107 mmol/L    CO2 TOTAL 26 21 - 31 mmol/L  ANION GAP 8 4 - 13 mmol/L    BUN 10 7 - 25 mg/dL    CREATININE 9.10 9.39 - 1.30 mg/dL    BUN/CREA RATIO 11 6 - 22    ESTIMATED GFR 114 >59 mL/min/1.34m^2    ALBUMIN 4.6 3.5 - 5.7 g/dL    CALCIUM 9.4 8.6 - 89.6 mg/dL    GLUCOSE 898 74 - 890 mg/dL    ALKALINE PHOSPHATASE 64 34 - 104 U/L    ALT (SGPT) 30 7 - 52 U/L    AST (SGOT) 19 13 - 39 U/L    BILIRUBIN TOTAL 0.3 0.3 - 1.0 mg/dL    PROTEIN TOTAL 7.1 6.4 - 8.9 g/dL    ALBUMIN/GLOBULIN RATIO 1.8 (H) 0.8 - 1.4    OSMOLALITY, CALCULATED 281 270 - 290 mOsm/kg    CALCIUM, CORRECTED 8.9 8.9 - 10.8 mg/dL    GLOBULIN 2.5 2.0 - 3.5       Radiology Results: No results found.     Immunization History   Administered Date(s) Administered    Covid-19 Vaccine,Pfizer-BioNTech,Purple Top,60yrs+ 01/30/2020, 02/02/2020    Influenza Vaccine, 6 month-adult 09/16/2020    Tetanus Toxoid/Diphtheria Toxoid/Acellular Pertussis Vaccine, Adsorbed 04/13/2008       DNR Status:  No Order    ASSESSMENT:  Right upper 3rd ureteral stone with hydronephrosis  Active Hospital Problems    Diagnosis    Hydronephrosis concurrent with and due to calculi of kidney and ureter       PLAN:    I explained to the patient that the level of his stone would require likely 2 surgeries since it is so far above the pelvis.  He will be kept NPO after midnight.  Risks and benefits of cystoscopy with ureteral stent placement were discussed with the patient and he verbalized understanding and agrees to proceed, all questions were answered to his satisfaction.    Burnard FORBES Malling, MD,       [1]   Allergies  Allergen Reactions    Latex  Other Adverse Reaction (Add comment)     unknown    Cephalexin  Other Adverse Reaction (Add comment)     Affect his sezure meds    Levetiracetam Mental Status Effect     Hears voices    Fentanyl   Other Adverse Reaction (Add  comment)     States he had issues with memory for 1 month    Carbamazepine  Other Adverse Reaction (Add comment)     Unknown      Diphenhydramine  Other Adverse Reaction (Add comment)     Unknown      Morphine  Other Adverse Reaction (Add comment)     Heart rate drops

## 2024-06-24 NOTE — ED Nurses Note (Signed)
 Bladder scan completed with no results of urine present in bladder.

## 2024-06-24 NOTE — Progress Notes (Signed)
 [facility]  [address]  [place] [address]  Operated by [facility]  Telephone Visit    Name:  JAMIL ARMWOOD MRN: [id number]   Date:  06/24/2024 DOB: 01/30/1986 (38 y.o.)          The patient/family initiated a request for telephone service.  Verbal consent for this service was obtained from the patient/family.  Reason for audio only:Patient preference    Last office visit in this department: 05/20/2024      Reason for call: ER Fu  Renal Stone    Call notes:  ER  Fu   This 38 yo went to ER for acute flank pain   Pt has seen Dr Eliott office in past   will check on coverage coming in   Pt is on ab and Flomax   He has no fever or chills     Pt still positive +  pain still pt has been urinating a  lot  and  feels  it is possibly  moving but slowly     Pt has surgery with [facility] re Hip surgery on Monday  he has been waiting for a long time to have this done.      ICD-10-CM    1. Renal stone  N20.0       2. Hydronephrosis, unspecified hydronephrosis type  N13.30         Urology will see pt at 9am on Thursday pt was made aware   Continue antibiotic and Flomax     LOS Determination: Medical Decision Making- Direct audio communication with patient was 6 minutes    Suzen DELENA Asa, PA-C

## 2024-06-24 NOTE — ED Nurses Note (Signed)
 Called report to Charlena Cross at 3S.

## 2024-06-24 NOTE — H&P (Signed)
 [place] [facility]    HOSPITALIST H&P    Andrew Collier 38 y.o. male ED06/ED06   Date of Service: 06/24/2024    Date of Admission:  06/24/2024   PCP: Suzen DELENA Asa, PA-C Code Status:FULL CODE: ATTEMPT RESUSCITATION/CPR     Chief Complaint:    Chief Complaint   Patient presents with    Flank Pain       HPI:   Patient is a 38 year old male with past medical history of hypertension presented to the emergency with right flank pain.  He mentions that he started having right flank pain on Sunday.  He went to [place] emergency department on Sunday where he had CT abdomen and pelvis 4 mm kidney stone in the proximal right ureter with mild-to-moderate right hydronephrosis.  He was discharged from [place] RE with oral Toradol, Macrobid, Pyridium and Flomax.  He had worsening abdominal pain today so presented to the emergency department.    In the ED he underwent ultrasound of his kidneys which showed small echogenic foci bilaterally likely representing kidney stones, and mild right-sided hydronephrosis.  ED spoke with urologist Dr. bewsey, we requested to admit the patient to the medical team, he is planning to take the patient to OR tomorrow for stent placement.  In the ED he received NS 1 L bolus, ketorolac and Zofran        ED medications:   Medications Ordered/Administered in the ED   ondansetron (ZOFRAN) 2 mg/mL injection (4 mg Intravenous Given 06/24/24 1317)   ketorolac (TORADOL) 30 mg/mL injection (30 mg Intravenous Given 06/24/24 1317)   NS bolus infusion 1,000 mL (0 mL Intravenous Stopped 06/24/24 1420)       PMHx:    Past Medical History:   Diagnosis Date    Celiac disease     Circadian rhythm sleep disorder     Epilepsy     GAD (generalized anxiety disorder)     Osteoarthritis     Paroxysmal supraventricular tachycardia (CMS HCC)         PSHx:   Past Surgical History:   Procedure Laterality Date    BLADDER SURGERY N/A     kidney stones  stents    pervaiz    COLONOSCOPY N/A      01/21/2020  duremedes    HX APPENDECTOMY N/A     laparoscopic    HX CHOLECYSTECTOMY      duremedes   2018    HX HERNIA REPAIR      bilateral   [facility]    HX HIP REPLACEMENT Left     20 06  Dr  Nunzio FUJISAWA    [facility] SHOULDER SURGERY Left     tear   Dr Joesph JOY UPPER ENDOSCOPY N/A     01/21/2020  dr  Lynnette    HX VASECTOMY N/A     ORTHOPEDIC SURGERY Left     uva   left knee cartialge removedal hip 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    RENAL ARTERY STENT N/A     SEPTOPLASTY N/A     05/28/2015          Allergies:    Allergies[1] Social History  Social History[2]    Family History  Family Medical History:       Problem Relation (Age of Onset)    Arthritis-rheumatoid Mother    Elevated Lipids Mother, Father    Hypertension (High Blood Pressure)  Mother               Home Meds:      Prior to Admission medications    Medication Sig Start Date End Date Taking? Authorizing Provider   atorvastatin  (LIPITOR) 40 mg Oral Tablet Take 1 Tablet (40 mg total) by mouth Daily  Patient taking differently: Take 0.5 Tablets (20 mg total) by mouth Daily 05/23/24  Yes Matzel, Kimberly A, PA-C   dicyclomine  (BENTYL ) 20 mg Oral Tablet Take 1 Tablet (20 mg total) by mouth Four times a day  Patient taking differently: Take 1 Tablet (20 mg total) by mouth Four times a day as needed for Other 05/20/24  Yes Matzel, Suzen LABOR, PA-C   eluxadoline  (VIBERZI ) 100 mg Oral Tablet Take 1 Tablet (100 mg total) by mouth Twice daily 05/20/24  Yes Matzel, Suzen LABOR, PA-C   famotidine  (PEPCID ) 40 mg Oral Tablet Take 1 Tablet (40 mg total) by mouth Twice daily 05/20/24  Yes Matzel, Kimberly A, PA-C   gabapentin  (NEURONTIN ) 600 mg Oral Tablet Take 1 Tablet (600 mg total) by mouth Three times a day 05/20/24  Yes Matzel, Suzen LABOR, PA-C   ketorolac  tromethamine  (TORADOL ) 10 mg Oral Tablet Take 1 Tablet (10 mg total) by mouth Every 6 hours as needed for Pain 06/22/24  Yes Eilene Oneil RAMAN, MD   lamoTRIgine  (LAMICTAL ) 200 mg Oral Tablet Take 1 Tablet (200 mg  total) by mouth Twice daily 05/20/24  Yes Matzel, Suzen LABOR, PA-C   Levocetirizine (XYZAL) 5 mg Oral Tablet Take 1 Tablet (5 mg total) by mouth Every evening 05/20/24  Yes Matzel, Suzen LABOR, PA-C   LORazepam  (ATIVAN ) 0.5 mg Oral Tablet Take 1 Tablet (0.5 mg total) by mouth Three times a day 02/29/24  Yes Matzel, Suzen LABOR, PA-C   meloxicam  (MOBIC ) 15 mg Oral Tablet Take 1 Tablet (15 mg total) by mouth Daily 05/20/24  Yes Matzel, Suzen LABOR, PA-C   metoprolol  succinate (TOPROL -XL) 25 mg Oral Tablet Sustained Release 24 hr Take 1 Tablet (25 mg total) by mouth Daily 05/20/24  Yes Matzel, Kimberly A, PA-C   montelukast  (SINGULAIR ) 10 mg Oral Tablet Take 1 Tablet (10 mg total) by mouth Every evening for 90 days 05/20/24 08/18/24 Yes Matzel, Suzen LABOR, PA-C   nitrofurantoin  monohyd/m-cryst (MACROBID ) 100 mg Oral Capsule Take 1 Capsule (100 mg total) by mouth Twice daily for 7 days 06/22/24 06/29/24 Yes Eilene Oneil RAMAN, MD   omeprazole  (PRILOSEC) 40 mg Oral Capsule, Delayed Release(E.C.) Take 1 Capsule (40 mg total) by mouth Daily 05/20/24  Yes Matzel, Suzen LABOR, PA-C   ondansetron  (ZOFRAN  ODT) 4 mg Oral Tablet, Rapid Dissolve Take 1 Tablet (4 mg total) by mouth Every 8 hours as needed for Nausea/Vomiting 06/22/24  Yes Eilene Oneil RAMAN, MD   phenazopyridine  (PYRIDIUM ) 200 mg Oral Tablet Take 1 Tablet (200 mg total) by mouth Three times a day for 3 days 06/22/24 06/25/24 Yes Eilene Oneil RAMAN, MD   tamsulosin  (FLOMAX ) 0.4 mg Oral Capsule Take 1 Capsule (0.4 mg total) by mouth Every evening after dinner for 30 days 06/22/24 07/22/24 Yes Eilene Oneil RAMAN, MD   mupirocin  (BACTROBAN ) 2 % Ointment Apply topically Three times a day 11/07/23 06/24/24  Paticia Suzen LABOR, PA-C   ondansetron  (ZOFRAN  ODT) 4 mg Oral Tablet, Rapid Dissolve Take 1 Tablet (4 mg total) by mouth Every 8 hours as needed for Nausea/Vomiting 05/20/24 06/22/24  Paticia Suzen A, PA-C        ROS:  Ten point review of the systems were negative except that mentioned in  HPI.      Results for orders placed or performed during the hospital encounter of 06/24/24 (from the past 24 hours)   LACTIC ACID LEVEL W/ REFLEX FOR LEVEL >2.0   Result Value Ref Range    LACTIC ACID 1.0 0.5 - 2.2 mmol/L   CBC WITH DIFF   Result Value Ref Range    WBC 10.9 (H) 3.6 - 10.2 x10^3/uL    RBC 5.31 4.06 - 5.63 x10^6/uL    HGB 16.4 (H) 12.5 - 16.3 g/dL    HCT 52.0 (H) 63.2 - 47.1 %    MCV 90.2 73.0 - 96.2 fL    MCH 31.0 23.8 - 33.4 pg    MCHC 34.4 32.5 - 36.3 g/dL    RDW 86.5 87.8 - 83.7 %    PLATELETS 275 140 - 440 x10^3/uL    MPV 7.8 7.4 - 11.4 fL    NEUTROPHIL % 64 44 - 74 %    LYMPHOCYTE % 20 15 - 43 %    MONOCYTE % 12 6 - 14 %    EOSINOPHIL % 3 1 - 8 %    BASOPHIL % 1 0 - 1 %    NEUTROPHIL # 7.00 1.85 - 7.84 x10^3/uL    LYMPHOCYTE # 2.20 1.00 - 3.00 x10^3/uL    MONOCYTE # 1.30 (H) 0.30 - 1.10 x10^3/uL    EOSINOPHIL # 0.30 0.00 - 0.60 x10^3/uL    BASOPHIL # 0.10 0.00 - 0.10 x10^3/uL   COMPREHENSIVE METABOLIC PANEL, NON-FASTING   Result Value Ref Range    SODIUM 141 136 - 145 mmol/L    POTASSIUM 4.1 3.5 - 5.1 mmol/L    CHLORIDE 107 98 - 107 mmol/L    CO2 TOTAL 26 21 - 31 mmol/L    ANION GAP 8 4 - 13 mmol/L    BUN 10 7 - 25 mg/dL    CREATININE 9.10 9.39 - 1.30 mg/dL    BUN/CREA RATIO 11 6 - 22    ESTIMATED GFR 114 >59 mL/min/1.67m^2    ALBUMIN 4.6 3.5 - 5.7 g/dL    CALCIUM 9.4 8.6 - 89.6 mg/dL    GLUCOSE 898 74 - 890 mg/dL    ALKALINE PHOSPHATASE 64 34 - 104 U/L    ALT (SGPT) 30 7 - 52 U/L    AST (SGOT) 19 13 - 39 U/L    BILIRUBIN TOTAL 0.3 0.3 - 1.0 mg/dL    PROTEIN TOTAL 7.1 6.4 - 8.9 g/dL    ALBUMIN/GLOBULIN RATIO 1.8 (H) 0.8 - 1.4    OSMOLALITY, CALCULATED 281 270 - 290 mOsm/kg    CALCIUM, CORRECTED 8.9 8.9 - 10.8 mg/dL    GLOBULIN 2.5 2.0 - 3.5        Physical:  Filed Vitals:    06/24/24 1445 06/24/24 1500 06/24/24 1515 06/24/24 1530   BP:  (!) 145/105 (!) 140/115 (!) 136/108   Pulse: 82 90 87 93   Resp:       Temp:       SpO2: 93% 94% 94% 95%         Physical Exam  Constitutional:       Appearance:  Normal appearance.   Cardiovascular:      Rate and Rhythm: Normal rate and regular rhythm.      Heart sounds: No murmur heard.  Pulmonary:      Effort: No respiratory distress.  Breath sounds: Normal breath sounds.   Abdominal:      General: Abdomen is flat.      Palpations: Abdomen is soft.   Musculoskeletal:      Right lower leg: No edema.      Left lower leg: No edema.   Neurological:      General: No focal deficit present.      Mental Status: He is alert and oriented to person, place, and time.          Diagnostic studies:  No results found.     EKG interpretation:     @PEVF @    Assessments:    38 year old male with past medical history of hypertension presented with a right flank pain, found to have ureteral stone and right-sided hydronephrosis.    # right flank pain  # mild-to-moderate right hydronephrosis with 4 mm calculus in the proximal right ureter in the CT abdominal pelvis  -ultrasound kidney showed a small echogenic foci bilaterally likely representing kidney stones and mild right hydronephrosis  -UA unremarkable  -patient reports that he had history of kidney stone 5/ 6 years ago which was treated with blast therapy  -ED provider spoke with on-call Urology, Dr. Sandee, who recommended medical admission and he will take the patient to the OR tomorrow for a stent placement       Plan:  -symptomatic management with pain control with Toradol  as needed, Zofran  for nausea  -continue IV fluids 100 mL/hour  -no signs of infection, we will hold off antibiotics  -npo from midnight    Code status: FULL CODE: ATTEMPT RESUSCITATION/CPR  DVT prophylaxis:  We will hold off deep vein thrombosis prophylaxis until after surgery.    Diet: DIET NPO - SPECIFIC DATE & TIME STRICT  DIET REGULAR Do you want to initiate MNT Protocol? Yes  DIET NPO - SPECIFIC DATE & TIME    Disposition:      Andrew Chio, MD    [place] [facility] HOSPITALIST          [1]   Allergies  Allergen Reactions    Latex  Other Adverse Reaction (Add  comment)     unknown    Cephalexin  Other Adverse Reaction (Add comment)     Affect his sezure meds    Levetiracetam Mental Status Effect     Hears voices    Fentanyl   Other Adverse Reaction (Add comment)     States he had issues with memory for 1 month    Carbamazepine  Other Adverse Reaction (Add comment)     Unknown      Diphenhydramine  Other Adverse Reaction (Add comment)     Unknown      Morphine  Other Adverse Reaction (Add comment)     Heart rate drops   [2]   Social History  Tobacco Use    Smoking status: Never    Smokeless tobacco: Never   Vaping Use    Vaping status: Never Used   Substance Use Topics    Alcohol use: Never    Drug use: Never

## 2024-06-24 NOTE — ED Nurses Note (Signed)
 Patient out of ED at this time with RN transporting upstairs. Patient in wheelchair. Telemetry box placed prior to leaving ER>

## 2024-06-24 NOTE — ED Triage Notes (Signed)
 Pt presents to the ED with c/o right flank pain x1 hour. Pt reports history of kidney stones. Pt reports symptoms are very much alike as previous kidney stones. Pt reports he is now unable to urinate.

## 2024-06-24 NOTE — ED Provider Notes (Signed)
 [facility]  ED Primary Provider Note      Name: Andrew Collier  Age and Gender: 38 y.o. male  Date of Birth: 10/31/1986  MRN: [id number]  PCP: Suzen DELENA Asa, PA-C    CC:  Chief Complaint   Patient presents with    Flank Pain       HPI:  Andrew Collier is a 38 y.o. White male who presents to the ER with flank pain.  Patient states that he was seen at [facility] ER on Sunday and was diagnosed with a 4 mm kidney stone in the right proximal ureter.  He states that he was discharged with medical expulsive therapy including Toradol, Macrobid, Pyridium and Flomax.  He states that he came in today due to worsening pain.  Patient states that he had a scheduled appointment with a new urologist, but could not wait.    Below pertinent information reviewed with patient:  Past Medical History:   Diagnosis Date    Celiac disease     Circadian rhythm sleep disorder     Epilepsy     GAD (generalized anxiety disorder)     Osteoarthritis     Paroxysmal supraventricular tachycardia (CMS HCC)        Allergies[1]    Past Surgical History:   Procedure Laterality Date    BLADDER SURGERY N/A     kidney stones  stents    pervaiz    COLONOSCOPY N/A     01/21/2020  duremedes    CYSTOSCOPY WITH RIGHT RETROGRADE PYELOGRAM; RIGHT URETEROSCOPY WITH STONE MANIPULATION Right 06/25/2024    Performed by Bewsey, Kelly E, MD at [facility] OR MAIN    HX APPENDECTOMY N/A     laparoscopic    HX CHOLECYSTECTOMY      duremedes   2018    HX HERNIA REPAIR      bilateral   [facility]    HX HIP REPLACEMENT Left     20 06  Dr  Nunzio FUJISAWA    [facility] SHOULDER SURGERY Left     tear   Dr Joesph JOY UPPER ENDOSCOPY N/A     01/21/2020  dr  Lynnette    HX VASECTOMY N/A     ORTHOPEDIC SURGERY Left     [facility]   left knee cartialge removedal hip 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    RENAL ARTERY STENT N/A     SEPTOPLASTY N/A     05/28/2015        Social History     Socioeconomic History    Marital status: Married   Tobacco Use    Smoking status:  Never    Smokeless tobacco: Never   Vaping Use    Vaping status: Never Used   Substance and Sexual Activity    Alcohol use: Never    Drug use: Never     Social Determinants of Health     Financial Resource Strain: Low Risk  (03/11/2024)    Received from [facility]    Overall Financial Resource Strain (CARDIA)     Difficulty of Paying Living Expenses: Not hard at all   Transportation Needs: No Transportation Needs (03/11/2024)    Received from [facility] - [facility]     Lack of Transportation (Medical): No     Lack of Transportation (Non-Medical): No   Social Connections: Medium Risk (06/24/2024)    Social  Connections     SDOH Social Isolation: 3 to 5 times a week   Intimate Partner Violence: Not At Risk (03/20/2024)    Received from [facility]    HITS     Over the last 12 months how often did your partner physically hurt you?: Never     Over the last 12 months how often did your partner insult you or talk down to you?: Never     Over the last 12 months how often did your partner threaten you with physical harm?: Never     Over the last 12 months how often did your partner scream or curse at you?: Never   Housing Stability: Low Risk  (03/11/2024)    Received from [facility] Stability Vital Sign     Unable to Pay for Housing in the Last Year: No     Number of Times Moved in the Last Year: 0     Homeless in the Last Year: No       ROS:  No other overt positive review of systems are noted other than stated in the HPI.      Objective:    ED Triage Vitals [06/24/24 1255]   BP (Non-Invasive) (!) 188/120   Heart Rate 76   Respiratory Rate (!) 23   Temperature 36.3 C (97.4 F)   SpO2 96 %   Weight 96.9 kg (213 lb 9.6 oz)   Height 1.651 m (5' 5)     Filed Vitals:    06/25/24 1215 06/25/24 1245 06/25/24 1345 06/25/24 1415   BP: (!) 160/118 (!) 148/108 (!) 152/105 (!) 146/102   Pulse: (!) 101 100 (!) 103 (!) 102   Resp:       Temp:       SpO2:           Nursing notes and vital signs  reviewed.    Constitutional - No acute distress.  Alert and Active.  HEENT - Normocephalic. Atraumatic. PERRL. EOMI. Conjunctiva clear.  Moist mucous membranes.   Neck - Trachea midline. No stridor. No hoarseness.  Cardiac - Regular rate and rhythm. No murmurs, rubs, or gallops. Intact distal pulses.  Respiratory/Chest - Normal respiratory effort. Clear to auscultation bilaterally. No rales, wheezes or rhonchi. No chest tenderness.  Abdomen - Normal bowel sounds. Non-tender, soft, non-distended.  Right CVA tenderness.  Musculoskeletal - Good AROM. No muscle or joint tenderness appreciated. No clubbing, cyanosis or edema.  Skin - Warm and dry, without any rashes or other lesions.  Neuro - Alert and oriented x 3. Cranial nerves II-XII are grossly intact.  Moving all extremities symmetrically. Normal gait.  Psych - Normal mood and affect. Behavior is normal        Any pertinent labs and imaging obtained during this encounter reviewed below in MDM.    MDM/ED Course:    Medical Decision Making  Patient presents to the ER with flank pain.  Patient states that he was seen at [facility] ER on Sunday and was diagnosed with a 4 mm kidney stone in the right proximal ureter.  He states that he was discharged with medical expulsive therapy including Toradol , Macrobid , Pyridium  and Flomax .  He states that he came in today due to worsening pain.  Patient states that he had a scheduled appointment with a new urologist, but could not wait.  Denies any fever chills nausea vomiting.    Differential diagnoses include:  Ureterolithiasis, nephrolithiasis, uncontrolled pain  Patient underwent diagnostics with results as noted in ED course.    Patient was found/suspected to have ureterolithiasis.  CBC showed an improved white blood cell count at 2.9, CMP was unremarkable.  Lactic acid was unremarkable.  Ultrasound did show continued mild right hydronephrosis.  I did speak with on-call Urology, Dr. Sandee, who recommended medical admission  and he will take the patient to the OR tomorrow for a stent placement.  Patient agreeable to treatment course.  Hospitalist consulted for admission    Patient will be admitted in stable condition at this time.    Amount and/or Complexity of Data Reviewed  ECG/medicine tests: independent interpretation performed.    Risk  Parenteral controlled substances.  Decision regarding hospitalization.            ED Course as of 06/27/24 0618   Tue Jun 24, 2024   1315 CBC/DIFF(!)  WBCs 10.9, hemoglobin 16.4, hematocrit 47.9, platelet 275   1325 COMPREHENSIVE METABOLIC PANEL, NON-FASTING(!)  Na 141, K4.1, BUN 10, creatinine 0.89   1433 Spoke to urologist, Dr. Sandee, states that patient to be admitted to hospitalist service and he will take to OR tomorrow for stent placement. He will be down to see patient after cases.   1435 LACTIC ACID: 1.0       Orders Placed This Encounter    URINE CULTURE,ROUTINE    US  KIDNEY    FLUORO RETROGRADE PYELOGRAM IN OR    CBC/DIFF    COMPREHENSIVE METABOLIC PANEL, NON-FASTING    LACTIC ACID LEVEL W/ REFLEX FOR LEVEL >2.0    URINALYSIS, MACROSCOPIC AND MICROSCOPIC W/CULTURE REFLEX    CBC WITH DIFF    URINALYSIS, MACROSCOPIC    URINALYSIS, MICROSCOPIC    EXTRA TUBES    BLUE TOP TUBE    GOLD TOP TUBE    STONE ANALYSIS W/ IMAGE    CANCELED: OXYGEN - NASAL CANNULA PRN IF O2 SATS < 93%    CANCELED: OXYGEN NASAL CANNULA  OVERNIGHT - MAY DISCONTINUE IN AM IF SAO2 IS ABOVE 92%    CANCELED: INSERT & MAINTAIN PERIPHERAL IV ACCESS    PATIENT CLASS/LEVEL OF CARE DESIGNATION - PR[facility]    ondansetron  (ZOFRAN ) 2 mg/mL injection    ketorolac  (TORADOL ) 30 mg/mL injection    NS bolus infusion 1,000 mL    NS premix infusion    gentamicin  100 mg in NS 100 mL premix IVPB       Impression:   Clinical Impression   Calculus of proximal right ureter (Primary)   Hydronephrosis, unspecified hydronephrosis type       Disposition: Admitted    / M. Sueanne Benders, APRN, FNP-C 06/24/2024, 14:24  [facility]  Department of Emergency Medicine  [place]    Portions of this note may have been dictated using voice recognition software.     -----------------------  No results found for this or any previous visit (from the past 12 hours).    FLUORO RETROGRADE PYELOGRAM IN OR   Final Result      US  KIDNEY   Final Result   MILD RIGHT-SIDED HYDRONEPHROSIS                     Radiologist location ID: [id number]                  [1]   Allergies  Allergen Reactions    Latex  Other Adverse Reaction (Add comment)     unknown    Cephalexin  Other Adverse Reaction (Add comment)     Affect his sezure meds    Levetiracetam Mental Status Effect     Hears voices    Fentanyl   Other Adverse Reaction (Add comment)     States he had issues with memory for 1 month    Carbamazepine  Other Adverse Reaction (Add comment)     Unknown      Diphenhydramine  Other Adverse Reaction (Add comment)     Unknown      Morphine  Other Adverse Reaction (Add comment)     Heart rate drops

## 2024-06-24 NOTE — ED Nurses Note (Signed)
 Patient ambulated to the bathroom and voided a large amount of urine at that time. Patient did obtain a urine sample.

## 2024-06-25 ENCOUNTER — Inpatient Hospital Stay (HOSPITAL_COMMUNITY): Admitting: Anesthesiology

## 2024-06-25 ENCOUNTER — Inpatient Hospital Stay (HOSPITAL_COMMUNITY)

## 2024-06-25 ENCOUNTER — Telehealth (INDEPENDENT_AMBULATORY_CARE_PROVIDER_SITE_OTHER): Payer: Self-pay | Admitting: Internal Medicine

## 2024-06-25 ENCOUNTER — Encounter (HOSPITAL_COMMUNITY): Admission: EM | Disposition: A | Discharge status: Home / Self Care | Payer: Self-pay | Source: Home / Self Care

## 2024-06-25 ENCOUNTER — Encounter (HOSPITAL_COMMUNITY): Payer: Self-pay

## 2024-06-25 ENCOUNTER — Inpatient Hospital Stay (HOSPITAL_COMMUNITY): Admit: 2024-06-25

## 2024-06-25 DIAGNOSIS — N132 Hydronephrosis with renal and ureteral calculous obstruction: Principal | ICD-10-CM

## 2024-06-25 SURGERY — CYSTOSCOPY WITH URETERAL STENT INSERTION
Anesthesia: General | Site: Ureter | Laterality: Right | Wound class: Clean Contaminated Wounds-The respiratory, GI, Genital, or urinary

## 2024-06-25 MED ORDER — FENTANYL (PF) 50 MCG/ML INJECTION WRAPPER
25.0000 ug | INJECTION | INTRAMUSCULAR | Status: DC | PRN
Start: 2024-06-25 — End: 2024-06-25

## 2024-06-25 MED ORDER — ACETAMINOPHEN 1,000 MG/100 ML (10 MG/ML) INTRAVENOUS SOLUTION
INTRAVENOUS | Status: AC
Start: 2024-06-25 — End: 2024-06-25
  Filled 2024-06-25: qty 100

## 2024-06-25 MED ORDER — LACTATED RINGERS INTRAVENOUS SOLUTION
INTRAVENOUS | Status: DC | PRN
Start: 2024-06-25 — End: 2024-06-25

## 2024-06-25 MED ORDER — MIDAZOLAM 5 MG/ML INJECTION WRAPPER
INTRAMUSCULAR | Status: AC
Start: 2024-06-25 — End: 2024-06-25
  Filled 2024-06-25: qty 1

## 2024-06-25 MED ORDER — ONDANSETRON HCL (PF) 4 MG/2 ML INJECTION SOLUTION
Freq: Once | INTRAMUSCULAR | Status: DC | PRN
Start: 2024-06-25 — End: 2024-06-25
  Administered 2024-06-25: 4 mg via INTRAVENOUS

## 2024-06-25 MED ORDER — FENTANYL (PF) 50 MCG/ML INJECTION WRAPPER
50.0000 ug | INJECTION | INTRAMUSCULAR | Status: DC | PRN
Start: 2024-06-25 — End: 2024-06-25

## 2024-06-25 MED ORDER — MIDAZOLAM 5 MG/ML INJECTION WRAPPER
Freq: Once | INTRAMUSCULAR | Status: DC | PRN
Start: 2024-06-25 — End: 2024-06-25
  Administered 2024-06-25: 2 mg via INTRAVENOUS

## 2024-06-25 MED ORDER — ACETAMINOPHEN 1,000 MG/100 ML (10 MG/ML) INTRAVENOUS SOLUTION
Freq: Once | INTRAVENOUS | Status: DC | PRN
Start: 2024-06-25 — End: 2024-06-25
  Administered 2024-06-25: 1000 mg via INTRAVENOUS

## 2024-06-25 MED ORDER — ALBUTEROL SULFATE 2.5 MG/3 ML (0.083 %) SOLUTION FOR NEBULIZATION
2.5000 mg | INHALATION_SOLUTION | Freq: Once | RESPIRATORY_TRACT | Status: DC | PRN
Start: 2024-06-25 — End: 2024-06-25

## 2024-06-25 MED ORDER — LIDOCAINE (PF) 100 MG/5 ML (2 %) INTRAVENOUS SYRINGE
INJECTION | Freq: Once | INTRAVENOUS | Status: DC | PRN
Start: 2024-06-25 — End: 2024-06-25
  Administered 2024-06-25 (×2): 100 mg via INTRAVENOUS

## 2024-06-25 MED ORDER — DEXAMETHASONE SODIUM PHOSPHATE 4 MG/ML INJECTION SOLUTION
Freq: Once | INTRAMUSCULAR | Status: DC | PRN
Start: 2024-06-25 — End: 2024-06-25
  Administered 2024-06-25: 4 mg via INTRAVENOUS

## 2024-06-25 MED ORDER — KETOROLAC 30 MG/ML (1 ML) INJECTION SOLUTION
Freq: Once | INTRAMUSCULAR | Status: DC | PRN
Start: 2024-06-25 — End: 2024-06-25
  Administered 2024-06-25: 15 mg via INTRAVENOUS

## 2024-06-25 MED ORDER — IPRATROPIUM 0.5 MG-ALBUTEROL 3 MG (2.5 MG BASE)/3 ML NEBULIZATION SOLN
3.0000 mL | INHALATION_SOLUTION | Freq: Once | RESPIRATORY_TRACT | Status: DC | PRN
Start: 2024-06-25 — End: 2024-06-25

## 2024-06-25 MED ORDER — GENTAMICIN 100 MG/100 ML IN SODIUM CHLORIDE(ISO) INTRAVENOUS PIGGYBACK
100.0000 mg | INJECTION | Freq: Once | INTRAVENOUS | Status: AC
Start: 2024-06-25 — End: 2024-06-25
  Administered 2024-06-25: 100 mg via INTRAVENOUS
  Filled 2024-06-25: qty 100

## 2024-06-25 MED ORDER — LACTATED RINGERS INTRAVENOUS SOLUTION
INTRAVENOUS | Status: DC
Start: 2024-06-25 — End: 2024-06-25
  Administered 2024-06-25: 0 mL via INTRAVENOUS

## 2024-06-25 MED ORDER — IOHEXOL 180 MG IODINE/ML INTRATHECAL SOLUTION
Freq: Once | INTRATHECAL | Status: DC | PRN
Start: 2024-06-25 — End: 2024-06-25
  Administered 2024-06-25: 10 mL via INTRAMUSCULAR

## 2024-06-25 MED ORDER — SUCCINYLCHOLINE 20 MG/ML INTRAVENOUS WRAPPER
INJECTION | Freq: Once | INTRAVENOUS | Status: DC | PRN
Start: 2024-06-25 — End: 2024-06-25
  Administered 2024-06-25: 140 mg via INTRAVENOUS

## 2024-06-25 MED ORDER — PROPOFOL 10 MG/ML IV BOLUS
INJECTION | Freq: Once | INTRAVENOUS | Status: DC | PRN
Start: 2024-06-25 — End: 2024-06-25
  Administered 2024-06-25: 200 mg via INTRAVENOUS

## 2024-06-25 MED ORDER — PROCHLORPERAZINE EDISYLATE 10 MG/2 ML (5 MG/ML) INJECTION SOLUTION
5.0000 mg | Freq: Once | INTRAMUSCULAR | Status: DC | PRN
Start: 2024-06-25 — End: 2024-06-25

## 2024-06-25 MED ORDER — FENTANYL (PF) 50 MCG/ML INJECTION SOLUTION
INTRAMUSCULAR | Status: AC
Start: 2024-06-25 — End: 2024-06-25
  Filled 2024-06-25: qty 2

## 2024-06-25 MED ORDER — FENTANYL (PF) 50 MCG/ML INJECTION WRAPPER
INJECTION | Freq: Once | INTRAMUSCULAR | Status: DC | PRN
Start: 2024-06-25 — End: 2024-06-25
  Administered 2024-06-25: 25 ug via INTRAVENOUS

## 2024-06-25 MED ORDER — ONDANSETRON HCL (PF) 4 MG/2 ML INJECTION SOLUTION
4.0000 mg | Freq: Once | INTRAMUSCULAR | Status: DC | PRN
Start: 2024-06-25 — End: 2024-06-25

## 2024-06-25 SURGICAL SUPPLY — 19 items
ADAPTER CATH 9FR SLFSL FEMALE LL CKFL STRL DISP (UROLOGICAL SUPPLIES) IMPLANT
BASKET STONE RTRVL 120CM 12MM 1.9FR 0TP URET NITINOL 4WR FLAT DIST SURF KNT TPLS STRL LF  DISP (UROLOGICAL SUPPLIES) ×1 IMPLANT
CATH URET FLEXIMA 5FR 70CM 1 LUM INJ HUB RADOPQ OP-EN STRL LF  DISP (UROLOGICAL SUPPLIES) ×1 IMPLANT
CLEANER INSTRUMENT PRE-KLENZ 13.5 OZ (MISCELLANEOUS PT CARE ITEMS) ×1 IMPLANT
CONV USE ITEM 321996 - GLOVE SURG 6.5 LF PF BEAD CUF SMOOTH STRL WHT 12IN (GLOVES AND ACCESSORIES) ×1 IMPLANT
GLOVE SURG 7.5 LF  PF BEAD CUF STRL CRM 11.8IN PROTEXIS PI PLISPRN THK9.1 MIL (GLOVES AND ACCESSORIES) ×1 IMPLANT
GOWN SURG LRG STD LGTH L3 HKLP CLSR RGLN SLEEVE TWL STRL LF  DISP GRN AERO BLU PRFRM FBRC (DRAPE/PACKS/SHEETS/OR TOWEL) ×2 IMPLANT
GW URO .035IN 150CM 3CM ZIPWR STR RADOPQ STD SHAFT NITINOL HDRPH URET LF (UROLOGICAL SUPPLIES) ×1 IMPLANT
LABEL MED CORRECT MED LABELING SYS 4 FLG 2 SHEET 24 PRPRNT STRL (MED SURG SUPPLIES) ×1 IMPLANT
MAT INSTR TRY 44X36IN WTPRF BACKSHEET TPNX BLU (MISCELLANEOUS PT CARE ITEMS) ×1 IMPLANT
PACK SURG CYSTOSCOPIC T MAYO DRP I STRL DISP 84X60IN 30IN LF (CUSTOM TRAYS & PACK) ×1 IMPLANT
SET .241IN 96IN 2 NONVENT PIERCE PIN 2 PINCH CLAMP SIGHT CHAMBER Y CONN IRRG 85ML TUR IV STRL LF (IV TUBING & ACCESSORIES) ×1 IMPLANT
SOL IRRG 0.9% NACL 2000ML PRSV FR N-PYRG FLXB CONTAINR STRL LF (MEDICATIONS/SOLUTIONS) ×1 IMPLANT
SOL IV 0.9% NACL 1000ML STRL PRSV FR FLXB CONTAINR LF (MEDICATIONS/SOLUTIONS) IMPLANT
SPONGE GAUZE 4X4IN MDCHC COTTON 12 PLY TY 7 LF  STRL DISP (WOUND CARE SUPPLY) ×1 IMPLANT
SYRINGE LL 10ML LF  STRL GRAD N-PYRG DEHP-FR PVC FREE MED DISP (MED SURG SUPPLIES) ×1 IMPLANT
SYRINGE LL 20ML STRL GRAD MED DISP (MED SURG SUPPLIES) ×1 IMPLANT
TOWEL 24X16IN COTTON BLU DISP SURG STRL LF (DRAPE/PACKS/SHEETS/OR TOWEL) ×1 IMPLANT
WATER STRL 1000ML PLASTIC PR BTL LF (MED SURG SUPPLIES) ×1 IMPLANT

## 2024-06-25 NOTE — Nurses Notes (Signed)
 Patient has been pain free through out the shift. All urine has been strained. No calculi noted.

## 2024-06-25 NOTE — Nurses Notes (Signed)
 Blood pressure 151/110; provider notified. No new orders at this time.

## 2024-06-25 NOTE — Anesthesia Postprocedure Evaluation (Signed)
 Anesthesia Post Op Evaluation    Patient: Andrew Collier  Procedure(s):  CYSTOSCOPY WITH RIGHT RETROGRADE PYELOGRAM; RIGHT URETEROSCOPY WITH STONE MANIPULATION    Last Vitals:Temperature: 36.8 C (98.3 F) (06/25/24 1059)  Heart Rate: 98 (06/25/24 1059)  BP (Non-Invasive): (!) 117/93 (06/25/24 1059)  Respiratory Rate: 16 (06/25/24 1059)  SpO2: 96 % (06/25/24 1100)    No notable events documented.    Patient is sufficiently recovered from the effects of anesthesia to participate in the evaluation and has returned to their pre-procedure level.  Patient location during evaluation: PACU       Patient participation: complete - patient participated  Level of consciousness: awake and alert and responsive to verbal stimuli    Pain management: adequate  Airway patency: patent    Anesthetic complications: no  Cardiovascular status: acceptable  Respiratory status: acceptable  Hydration status: acceptable  Patient post-procedure temperature: Pt Normothermic   PONV Status: Absent

## 2024-06-25 NOTE — OR Surgeon (Signed)
 [facility]    OPERATIVE NOTE    Patient Name: Andrew Collier, Andrew Collier MRN: [id number]  Date of Birth: [birth year]  Date of Service: 06/25/2024     Pre-Operative Diagnosis: Right ureteral stone [N20.1]     Post-Operative Diagnosis: Right ureteral stone    Procedure(s)/Description:  CYSTOSCOPY WITH RIGHT RETROGRADE PYELOGRAM; RIGHT URETEROSCOPY WITH STONE MANIPULATION: 47667 (CPT)       Attending Surgeon: Burnard FORBES Malling, MD     Assistant Surgeon: Surgeons and Role:     DEWAINE Malling Burnard FORBES, MD - Primary     Anesthesia Staff:  Anesthesiologist: Horacio Rush, MD  CRNA: Francetta Ros, CRNA    Anesthesia Type: General     First Assist: None     Fluids:  None    Estimated Blood Loss: minimal     Specimens Removed:   Order Name Source Comment Collection Info Order Time   STONE ANALYSIS W/ IMAGE Stone Pre-op diagnosis:  Right ureteral stone [N20.1] Collected By: Malling Burnard FORBES, MD 06/25/2024 10:41 AM     Release to patient   Automated             Drains:  None    Complications:  None    Condition:  Stable    Disposition:   PACU - hemodynamically stable.    Indications:  Patient is a 38 y.o. male who presents for cystoscopy with retrograde pyelogram and possible stone manipulation or stone due to right ureteral stone with hydronephrosis.    Description of Procedure/Operation:  Patient brought to the operating room and placed in supine position.  General anesthesia was induced and he was then placed in dorsal lithotomy position, prepped and draped in usual fashion.  A time-out was performed and a 21 French cystoscope with a 30 degree lens was placed under direct visualization of the urinary bladder.  The right ureteral orifice was identified.  A 5 French open-ended catheter was placed and Isovue was injected in a retrograde fashion under fluoroscopic visualization.  There were some mild right hydronephrosis with no significant pelvocaliectasis noted.  There was a filling defect noted in the distal right ureter  which was distal to where the stone was located yesterday.  At this point a zip wire was placed under direct fluoroscopic visualization into the right renal pelvis.  A short rigid ureteral scope was then obtained and passed under direct visualization into the urinary bladder and into the right ureter under direct visualization.  The stone was identified approximately 2 cm proximal to the ureterovesical junction.  I then placed a 0 tip Nitinol basket through the ureteral scope and was able to engage the ureteral stone and remove it under direct visualization without any difficulty.  The stone will be sent for chemical analysis.  The cystoscope was then replaced under direct visualization in the bladder was drained.  The procedure was then terminated.  The patient tolerated this well and was sent in stable condition to postanesthesia recovery.    Burnard FORBES Malling, MD

## 2024-06-25 NOTE — Nurses Notes (Signed)
 Patient arrived to room 372 from PACU. Patient voided 400 after arriving on the floor. Blood pressure is high, CN and MD aware. No new orders at this time. Will continue to monitor.    VS:  BP 148/108  HR 95  O2 92% RA  RR 16

## 2024-06-25 NOTE — Anesthesia Preprocedure Evaluation (Addendum)
 ANESTHESIA PRE-OP EVALUATION  Planned Procedure: CYSTOSCOPY WITH URETERAL STENT INSERTION (Right: Ureter)  Review of Systems     anesthesia history negative     patient summary reviewed          Pulmonary  negative pulmonary ROS,    Cardiovascular    Dysrhythmias (h/o Paroxysmal supraventricular tachycardia) and hyperlipidemia ,No peripheral edema,  Exercise Tolerance: > or = 4 METS   ,beta blocker therapy      GI/Hepatic/Renal    kidney stones        Endo/Other    osteoarthritis and obesity,      Neuro/Psych/MS    seizures, anxiety     Cancer    negative hematology/oncology ROS,                     Physical Assessment      Airway       Mallampati: II    TM distance: 3 FB    Neck ROM: full  Mouth Opening: good.  Facial hair          Dental       Dentition intact             Pulmonary    Breath sounds clear to auscultation  (-) no rhonchi, no decreased breath sounds, no wheezes, no rales and no stridor     Cardiovascular    Rhythm: regular  Rate: Normal  (-) no friction rub, carotid bruit is not present, no peripheral edema and no murmur     Other findings              Plan  ASA 2     Planned anesthesia type: general     general anesthesia with endotracheal tube intubation                    Intravenous induction     Anesthesia issues/risks discussed are: Dental Injuries, PONV, Cardiac Events/MI, Intraoperative Awareness/ Recall, Sore Throat, Aspiration, Stroke and Post-op Pain Management.  Anesthetic plan and risks discussed with patient  signed consent obtained          Patient's NPO status is appropriate for Anesthesia.

## 2024-06-25 NOTE — Discharge Summary (Signed)
 [facility]  DISCHARGE SUMMARY    PATIENT NAME:  Andrew Collier, Andrew Collier  MRN:  [id number]  DOB:  1986/10/03    ENCOUNTER DATE:  06/24/2024  INPATIENT ADMISSION DATE: 06/24/2024  DISCHARGE DATE:  06/25/2024    ATTENDING PHYSICIAN: Roann Crown, MD  SERVICE: [facility]  PRIMARY CARE PHYSICIAN: Suzen DELENA Asa, PA-C       No lay caregiver identified.    PRIMARY DISCHARGE DIAGNOSIS: Hydronephrosis concurrent with and due to calculi of kidney and ureter  Active Hospital Problems    Diagnosis Date Noted    Principal Problem: Hydronephrosis concurrent with and due to calculi of kidney and ureter [N13.2] 06/24/2024    Right ureteral stone [N20.1] 06/24/2024      Resolved Hospital Problems   No resolved problems to display.     Active Non-Hospital Problems    Diagnosis Date Noted    Gluten intolerance 05/20/2024    Streptococcus exposure 10/07/2023    Circadian rhythm sleep disorder 01/26/2022    GAD (generalized anxiety disorder) 01/26/2022    Osteoarthritis 01/26/2022    Thoracic back pain 01/26/2022    Diarrhea 01/26/2022             Current Discharge Medication List        START taking these medications.        Details   lamoTRIgine  200 mg Tablet  Commonly known as: LaMICtal    200 mg, Oral, 2 TIMES DAILY  Qty: 180 Tablet  Refills: 2            CONTINUE these medications which have CHANGED during your visit.        Details   atorvastatin  40 mg Tablet  Commonly known as: LIPITOR  What changed: how much to take   40 mg, Oral, Daily  Qty: 90 Tablet  Refills: 2            CONTINUE these medications - NO CHANGES were made during your visit.        Details   famotidine  40 mg Tablet  Commonly known as: PEPCID    40 mg, Oral, 2 TIMES DAILY  Qty: 180 Tablet  Refills: 3     gabapentin  600 mg Tablet  Commonly known as: NEURONTIN    600 mg, Oral, 3 TIMES DAILY  Qty: 90 Tablet  Refills: 2     ketorolac  tromethamine  10 mg Tablet  Commonly known as: TORADOL    10 mg, Oral, EVERY 6 HOURS PRN  Qty: 20 Tablet  Refills: 0      Levocetirizine 5 mg Tablet  Commonly known as: XYZAL   5 mg, Oral, EVERY EVENING  Qty: 90 Tablet  Refills: 3     meloxicam  15 mg Tablet  Commonly known as: MOBIC    15 mg, Oral, Daily  Qty: 90 Tablet  Refills: 0     metoprolol  succinate 25 mg Tablet Sustained Release 24 hr  Commonly known as: TOPROL -XL   25 mg, Oral, Daily  Qty: 90 Tablet  Refills: 2     montelukast  10 mg Tablet  Commonly known as: SINGULAIR    10 mg, Oral, EVERY EVENING  Qty: 90 Tablet  Refills: 3     nitrofurantoin  monohyd/m-cryst 100 mg Capsule  Commonly known as: Macrobid    100 mg, Oral, 2 TIMES DAILY  Qty: 14 Capsule  Refills: 0     omeprazole  40 mg Capsule, Delayed Release(E.C.)  Commonly known as: PRILOSEC   40 mg, Oral, Daily  Qty: 90 Capsule  Refills: 3  ondansetron  4 mg Tablet, Rapid Dissolve  Commonly known as: ZOFRAN  ODT   4 mg, Oral, EVERY 8 HOURS PRN  Qty: 12 Tablet  Refills: 0     phenazopyridine  200 mg Tablet  Commonly known as: PYRIDIUM    200 mg, Oral, 3 TIMES DAILY  Qty: 9 Tablet  Refills: 0     tamsulosin  0.4 mg Capsule  Commonly known as: FLOMAX    0.4 mg, Oral, EVERY EVENING AFTER DINNER  Qty: 30 Capsule  Refills: 0     Viberzi  100 mg Tablet  Generic drug: eluxadoline    100 mg, Oral, 2 TIMES DAILY  Qty: 180 Tablet  Refills: 3            Discharge med list refreshed?  YES     Allergies[1]  HOSPITAL PROCEDURE(S):   No orders of the defined types were placed in this encounter.    Surgical/Procedural Cases on this Admission       Case IDs Date Procedure Surgeon Location Status    [phone] 06/25/24 CYSTOSCOPY WITH RIGHT RETROGRADE PYELOGRAM; RIGHT URETEROSCOPY WITH STONE MANIPULATION Sandee Burnard BRAVO, MD [facility]          REASON FOR HOSPITALIZATION AND HOSPITAL COURSE   BRIEF HPI:  This is a 38 y.o., male admitted for   Patient is a 38 year old male with past medical history of hypertension presented to the emergency with right flank pain.  He mentions that he started having right flank pain on Sunday.  He went to [facility]  emergency department on Sunday where he had CT abdomen and pelvis 4 mm kidney stone in the proximal right ureter with mild-to-moderate right hydronephrosis.  He was discharged from [facility] with oral Toradol , Macrobid , Pyridium  and Flomax .  He had worsening abdominal pain today so presented to the emergency department.     In the ED he underwent ultrasound of his kidneys which showed small echogenic foci bilaterally likely representing kidney stones, and mild right-sided hydronephrosis.  ED spoke with urologist Dr. sandee, we requested to admit the patient to the medical team, he is planning to take the patient to OR tomorrow for stent placement.  In the ED he received NS 1 L bolus, ketorolac  and Zofran     Physical Exam  Constitutional:       Appearance: Normal appearance.   Cardiovascular:      Rate and Rhythm: Normal rate and regular rhythm.      Heart sounds: No murmur heard.  Pulmonary:      Effort: No respiratory distress.      Breath sounds: Normal breath sounds.   Abdominal:      General: Abdomen is flat.      Palpations: Abdomen is soft.   Musculoskeletal:      Right lower leg: No edema.      Left lower leg: No edema.   Neurological:      General: No focal deficit present.      Mental Status: He is alert and oriented to person, place, and time.    BRIEF HOSPITAL NARRATIVE:     Patient was admitted for right flank pain.  He was found to have right ureteral stone with right-sided mild hydronephrosis in CT scan and ultrasound.  Urology was consulted who did cystoscopy with right retrograde pyelogram, right ureteroscopy with stone removal on 07/30.  Patient is now discharged back home        TRANSITION/POST DISCHARGE CARE/PENDING TESTS/REFERRALS:  Outpatient follow-up with Urology    CONDITION ON  DISCHARGE:  A. Ambulation: Full ambulation  B. Self-care Ability: Complete  C. Cognitive Status Oriented x 3  D. Code status at discharge:       LINES/DRAINS/WOUNDS AT DISCHARGE:   Patient Lines/Drains/Airways  Status       Active Line / Dialysis Catheter / Dialysis Graft / Drain / Airway / Wound       Name Placement date Placement time Site Days    Peripheral IV Posterior;Right Dorsal Metacarpals  (top of hand) 06/24/24  1300  -- 1                    DISCHARGE DISPOSITION:  Home discharge  DISCHARGE INSTRUCTIONS:  Post-Discharge Follow Up Appointments       Friday Sep 19, 2024    Return Patient Visit with Paticia Suzen LABOR, PA-C at 10:00 AM      Internal Medicine, Building [facility]  [facility], [place]  [address]  [place] [address]  [phone]          No discharge procedures on file.       Dylann Gallier, MD    Copies sent to Care Team         Relationship Specialty Notifications Start End    Matzel, Suzen LABOR RIGGERS PCP - General PHYSICIAN ASSISTANT Yes. All results, Admissions 01/25/22     Phone: [phone] Fax: [phone]         [address] [place] [address]            Referring providers can utilize [email or web address] to access their referred [facility] Medicine patient's information.                               [1]   Allergies  Allergen Reactions    Latex  Other Adverse Reaction (Add comment)     unknown    Cephalexin  Other Adverse Reaction (Add comment)     Affect his sezure meds    Levetiracetam Mental Status Effect     Hears voices    Fentanyl   Other Adverse Reaction (Add comment)     States he had issues with memory for 1 month    Carbamazepine  Other Adverse Reaction (Add comment)     Unknown      Diphenhydramine  Other Adverse Reaction (Add comment)     Unknown      Morphine  Other Adverse Reaction (Add comment)     Heart rate drops

## 2024-06-25 NOTE — Interval H&P Note (Signed)
 [facility]      H&P UPDATE FORM                                                                                  Coden, Franchi, 38 y.o. male  Date of Admission:  06/24/2024  Date of Birth:  04-24-1986    06/25/2024    STOP: IF H&P IS GREATER THAN 30 DAYS FROM SURGICAL DAY COMPLETE NEW H&P IS REQUIRED.     H & P updated the day of the procedure.  1.  H&P completed within 30 days of surgical procedure and has been reviewed within 24 hours of admission but prior to surgery or a procedure requiring anesthesia services, the patient has been examined, and no change has occured in the patients condition since the H&P was completed.       Change in medications: No              Comments:     2.  Patient continues to be appropriate candidate for planned surgical procedure. YES    Burnard FORBES Malling, MD

## 2024-06-25 NOTE — Nurses Notes (Signed)
 Bedside hand off given un:Jdyozb Tammy, LPN  Vitals Signs as follows: Temp:97.55f                                        HR:95                                        Resp:16                                        BP:148/108                                        O2:92% room air  IV patent,receiving nurse to medicate patient for elevated blood pressure.  Dressing Status:no dressing/no drainage. Vital signs obtained.

## 2024-06-26 ENCOUNTER — Ambulatory Visit (INDEPENDENT_AMBULATORY_CARE_PROVIDER_SITE_OTHER): Payer: Self-pay

## 2024-06-26 ENCOUNTER — Telehealth (INDEPENDENT_AMBULATORY_CARE_PROVIDER_SITE_OTHER): Payer: Self-pay | Admitting: Internal Medicine

## 2024-06-26 ENCOUNTER — Telehealth (HOSPITAL_COMMUNITY): Payer: Self-pay | Admitting: Internal Medicine

## 2024-06-26 LAB — URINE CULTURE,ROUTINE: URINE CULTURE: NO GROWTH

## 2024-06-26 NOTE — Telephone Encounter (Signed)
 Transition of Care Contact Information  Discharge Date: 06/25/2024  Transition Facility Type--Hospital (Inpatient or Observation)  Facility Name--[facility]  Interactive Contact(s): Completed or attempted contact indicated by Date/Time  First Attempt Call: 06/26/2024  8:48 AM  Second Attempted Contact: 06/26/2024  8:50 AM  Contact Method(s)-- Patient/Caregiver Telephone, MyChart Patient Portal  Clinical Staff Name/Role who Alyce Satterfield, RN  Transition Note:Left VM with NN contact information, MyChart message sent.    Logan Satterfield, RN

## 2024-06-27 ENCOUNTER — Encounter (INDEPENDENT_AMBULATORY_CARE_PROVIDER_SITE_OTHER): Payer: Self-pay | Admitting: Internal Medicine

## 2024-06-27 ENCOUNTER — Telehealth: Payer: Self-pay | Admitting: Internal Medicine

## 2024-06-27 DIAGNOSIS — I10 Essential (primary) hypertension: Secondary | ICD-10-CM

## 2024-06-27 MED ORDER — TELMISARTAN 40 MG TABLET
40.0000 mg | ORAL_TABLET | Freq: Every day | ORAL | 2 refills | Status: AC
Start: 2024-06-27 — End: ?

## 2024-06-27 NOTE — Progress Notes (Signed)
 [facility]  [address]  [place] [address]  Operated by [facility]  Telephone Visit    Name:  Andrew Collier MRN: [id number]   Date:  06/27/2024 DOB: November 11, 1986 (38 y.o.)          The patient/family initiated a request for telephone service.  Verbal consent for this service was obtained from the patient/family.  Reason for audio only:Patient preference    Last office visit in this department: 05/20/2024      Reason for call:No chief complaint on file.       Telemed   Pt called with elevated  BP  the evening after  his renal stone procedure  Pt says it is still staying up and  has had Ha    Pt to have surgery on hip on  Tues  and  BP still  150 - 160 100 range  last BP   146/108     Pt has had no nvd    Pt is on metoprolol   25 mg            Allergies[1]     atorvastatin  (LIPITOR) 40 mg Oral Tablet, Take 1 Tablet (40 mg total) by mouth Daily  eluxadoline  (VIBERZI ) 100 mg Oral Tablet, Take 1 Tablet (100 mg total) by mouth Twice daily  famotidine  (PEPCID ) 40 mg Oral Tablet, Take 1 Tablet (40 mg total) by mouth Twice daily  gabapentin  (NEURONTIN ) 600 mg Oral Tablet, Take 1 Tablet (600 mg total) by mouth Three times a day  ketorolac  tromethamine  (TORADOL ) 10 mg Oral Tablet, Take 1 Tablet (10 mg total) by mouth Every 6 hours as needed for Pain  lamoTRIgine  (LAMICTAL ) 200 mg Oral Tablet, Take 1 Tablet (200 mg total) by mouth Twice daily  Levocetirizine (XYZAL) 5 mg Oral Tablet, Take 1 Tablet (5 mg total) by mouth Every evening  meloxicam  (MOBIC ) 15 mg Oral Tablet, Take 1 Tablet (15 mg total) by mouth Daily  metoprolol  succinate (TOPROL -XL) 25 mg Oral Tablet Sustained Release 24 hr, Take 1 Tablet (25 mg total) by mouth Daily  montelukast  (SINGULAIR ) 10 mg Oral Tablet, Take 1 Tablet (10 mg total) by mouth Every evening for 90 days  nitrofurantoin  monohyd/m-cryst (MACROBID ) 100 mg Oral Capsule, Take 1 Capsule (100 mg total) by mouth Twice daily for 7 days  omeprazole  (PRILOSEC) 40 mg  Oral Capsule, Delayed Release(E.C.), Take 1 Capsule (40 mg total) by mouth Daily  ondansetron  (ZOFRAN  ODT) 4 mg Oral Tablet, Rapid Dissolve, Take 1 Tablet (4 mg total) by mouth Every 8 hours as needed for Nausea/Vomiting  tamsulosin  (FLOMAX ) 0.4 mg Oral Capsule, Take 1 Capsule (0.4 mg total) by mouth Every evening after dinner for 30 days    No facility-administered medications prior to visit.       ROS     OBJECTIVE:  There were no vitals filed for this visit.     Const  General: cooperative, no acute distress and alert  Resp  Effort & Inspection: able to speak in complete sentences  Psych  Mental Status: mental status grossly normal  Mood: congruent mood  Affect: normal affect  Attitude: cooperative  Insight: insight good  Judgment: judgment good      ICD-10-CM    1. Hypertension, unspecified type  I10           PLAN: Treatment per orders . Call or return to clinic prn if these symptoms worsen or fail to improve as anticipated.  Meds reviewed as well  as labs.  Start  benicar  40 mg daily     Pt to monitor BP     Pt to have surgery on Tues next week for  hip   Will fu  with pt post op   Regarding recent renal stone procedure pt doing well and  no issues with urination       LOS Determination: Medical Decision Making- Direct audio communication with patient was 5 minutes    Suzen DELENA Asa, PA-C       [1]   Allergies  Allergen Reactions    Latex  Other Adverse Reaction (Add comment)     unknown    Cephalexin  Other Adverse Reaction (Add comment)     Affect his sezure meds    Levetiracetam Mental Status Effect     Hears voices    Fentanyl   Other Adverse Reaction (Add comment)     States he had issues with memory for 1 month    Carbamazepine  Other Adverse Reaction (Add comment)     Unknown      Diphenhydramine  Other Adverse Reaction (Add comment)     Unknown      Morphine  Other Adverse Reaction (Add comment)     Heart rate drops

## 2024-07-02 ENCOUNTER — Other Ambulatory Visit (INDEPENDENT_AMBULATORY_CARE_PROVIDER_SITE_OTHER): Payer: Self-pay | Admitting: Internal Medicine

## 2024-07-02 ENCOUNTER — Telehealth (INDEPENDENT_AMBULATORY_CARE_PROVIDER_SITE_OTHER): Payer: Self-pay | Admitting: Internal Medicine

## 2024-07-03 LAB — STONE ANALYSIS W/ IMAGE: STONE WEIGHT: 0.034 g

## 2024-07-07 ENCOUNTER — Ambulatory Visit (INDEPENDENT_AMBULATORY_CARE_PROVIDER_SITE_OTHER): Payer: Self-pay | Admitting: Internal Medicine

## 2024-07-08 ENCOUNTER — Ambulatory Visit (INDEPENDENT_AMBULATORY_CARE_PROVIDER_SITE_OTHER): Payer: Self-pay

## 2024-07-08 ENCOUNTER — Other Ambulatory Visit: Payer: Self-pay

## 2024-07-08 ENCOUNTER — Encounter (INDEPENDENT_AMBULATORY_CARE_PROVIDER_SITE_OTHER): Payer: Self-pay

## 2024-07-08 VITALS — BP 134/86 | HR 83 | Ht 65.0 in | Wt 206.0 lb

## 2024-07-08 DIAGNOSIS — Z9889 Other specified postprocedural states: Secondary | ICD-10-CM

## 2024-07-08 DIAGNOSIS — N201 Calculus of ureter: Secondary | ICD-10-CM

## 2024-07-08 DIAGNOSIS — Z87442 Personal history of urinary calculi: Secondary | ICD-10-CM

## 2024-07-08 DIAGNOSIS — Z09 Encounter for follow-up examination after completed treatment for conditions other than malignant neoplasm: Secondary | ICD-10-CM

## 2024-07-08 NOTE — Progress Notes (Signed)
 UROLOGY, [facility]  [address] [place] [address]    Progress Note    Name: Andrew Collier MRN:  [id number]   Date: 07/08/2024 DOB:  [birth year] (38 y.o.)             Chief Complaint: Kidney Stones (Right ureteroscopy on 06/25/24)  Subjective   Subjective:   38 year old male presents for follow up.  He has a history of kidney stones.  He underwent right ureteroscopy with stone extraction on July 30th.  He was here to follow up on the stone analysis.  He reports a previous history of two other kidney stones that were treated.  He had no complaints today.     Objective   Objective :    Physical examination:  BP 134/86 (Site: Right Arm, Patient Position: Sitting, Cuff Size: Adult)   Pulse 83   Ht 1.651 m (5' 5)   Wt 93.4 kg (206 lb)   BMI 34.28 kg/m        General: Well-developed, well-nourished, in no apparent distress.  HEENT: Grossly normal.  Cardiac: Regular rate and rhythm.  Pulm: No dyspnea  Abdomen: Soft, without tenderness or mass.  GU:  Skin: Warm and dry.  Extremities: Moves all extremities.  Using crutches  Neurologic: Alert and oriented.    Data reviewed:    Current Outpatient Medications   Medication Sig    aspirin (ECOTRIN) 81 mg Oral Tablet, Delayed Release (E.C.) Take 1 Tablet (81 mg total) by mouth Twice daily    atorvastatin  (LIPITOR) 40 mg Oral Tablet Take 1 Tablet (40 mg total) by mouth Daily    eluxadoline  (VIBERZI ) 100 mg Oral Tablet Take 1 Tablet (100 mg total) by mouth Twice daily    famotidine  (PEPCID ) 40 mg Oral Tablet Take 1 Tablet (40 mg total) by mouth Twice daily    gabapentin  (NEURONTIN ) 600 mg Oral Tablet Take 1 Tablet (600 mg total) by mouth Three times a day    ketorolac  tromethamine  (TORADOL ) 10 mg Oral Tablet Take 1 Tablet (10 mg total) by mouth Every 6 hours as needed for Pain    lamoTRIgine  (LAMICTAL ) 200 mg Oral Tablet Take 1 Tablet (200 mg total) by mouth Twice daily    Levocetirizine (XYZAL) 5 mg Oral Tablet Take 1 Tablet (5 mg total) by mouth Every  evening    meloxicam  (MOBIC ) 15 mg Oral Tablet Take 1 Tablet (15 mg total) by mouth Daily    metoprolol  succinate (TOPROL -XL) 25 mg Oral Tablet Sustained Release 24 hr Take 1 Tablet (25 mg total) by mouth Daily    montelukast  (SINGULAIR ) 10 mg Oral Tablet Take 1 Tablet (10 mg total) by mouth Every evening for 90 days    naproxen (NAPROSYN) 500 mg Oral Tablet Take 1 Tablet (500 mg total) by mouth Twice daily with food    omeprazole  (PRILOSEC) 40 mg Oral Capsule, Delayed Release(E.C.) Take 1 Capsule (40 mg total) by mouth Daily    ondansetron  (ZOFRAN  ODT) 4 mg Oral Tablet, Rapid Dissolve Take 1 Tablet (4 mg total) by mouth Every 8 hours as needed for Nausea/Vomiting    oxyCODONE (ROXICODONE) 5 mg Oral Tablet Take 1 Tablet (5 mg total) by mouth    tamsulosin  (FLOMAX ) 0.4 mg Oral Capsule Take 1 Capsule (0.4 mg total) by mouth Every evening after dinner for 30 days    telmisartan  (MICARDIS ) 40 mg Oral Tablet Take 1 Tablet (40 mg total) by mouth Daily  Assessment/Plan  Problem List Items Addressed This Visit    None    38 year old male with a history of kidney stones status post right ureteroscopy with stone extraction.  The stone analysis was calcium oxalate.  The stone diet was discussed briefly.  The patient will undergo stone risk analysis and follow up in approximately 4 weeks.    Lonni Mead, MD

## 2024-07-22 ENCOUNTER — Ambulatory Visit: Payer: Self-pay | Attending: Internal Medicine | Admitting: Internal Medicine

## 2024-07-22 ENCOUNTER — Other Ambulatory Visit: Payer: Self-pay

## 2024-07-22 ENCOUNTER — Encounter (INDEPENDENT_AMBULATORY_CARE_PROVIDER_SITE_OTHER): Payer: Self-pay | Admitting: Internal Medicine

## 2024-07-22 VITALS — BP 130/86 | HR 115 | Ht 65.0 in

## 2024-07-22 DIAGNOSIS — Z23 Encounter for immunization: Secondary | ICD-10-CM | POA: Insufficient documentation

## 2024-07-22 DIAGNOSIS — Z09 Encounter for follow-up examination after completed treatment for conditions other than malignant neoplasm: Secondary | ICD-10-CM | POA: Insufficient documentation

## 2024-07-22 DIAGNOSIS — S73199A Other sprain of unspecified hip, initial encounter: Secondary | ICD-10-CM | POA: Insufficient documentation

## 2024-07-22 DIAGNOSIS — K580 Irritable bowel syndrome with diarrhea: Secondary | ICD-10-CM | POA: Insufficient documentation

## 2024-07-22 DIAGNOSIS — N201 Calculus of ureter: Secondary | ICD-10-CM | POA: Insufficient documentation

## 2024-07-22 DIAGNOSIS — M792 Neuralgia and neuritis, unspecified: Secondary | ICD-10-CM | POA: Insufficient documentation

## 2024-07-22 NOTE — Progress Notes (Unsigned)
 [facility]  [address]  [place] [address]  Operated by [facility]  Transitional Care Management Note    Name: Andrew Collier MRN:  [id number]   Date: 07/22/2024 Age: 38 y.o.     Chief Complaint: Hospital Follow Up [facility] follow up   kidney stone removed and hip surgery) and Hospital Discharge Transition       SUBJECTIVE:  Andrew Collier is a 38 y.o. male presenting today for follow-up after being discharged. The main problem requiring admission was in for  renal stone   than to next surgery  labrum tear and  top of femur  shaved.   Pt hospital complications   3 weeks post op and way ahead as he is off crutches as of yesterday     Pt is going to Therapy at  [employer] [facility]  2 days a week    Surgery was  1 hr        Pt said no high impact  running rugby etc    No other restrictions    Dr Velva    [facility]     Pt says this is the first time yrs since  he had  surgery  yrs ago  that He is pain free  in  socket     S/p Labrum tear surgery     Post renal stone  pt was on ab few  days      06/24/2024  CYSTOSCOPY WITH RIGHT RETROGRADE PYELOGRAM; RIGHT URETEROSCOPY WITH STONE MANIPULATION   Dr Sandee       I/O 's all good off Flomax        Both hospital records reviewed    OBJECTIVE:   BP 130/86 (Site: Left Arm, Patient Position: Sitting)   Pulse (!) 115   Ht 1.651 m (5' 5)   SpO2 94%   BMI 34.28 kg/m   Exam-AMB  Const  General: cooperative, healthy appearing and no acute distress  Orientation/Consciousness: patient oriented x3  HENMT  Ears: hearing grossly normal bilaterally  Eyes  General: appearance normal, both eyes and all related structures  Conjunctivae: conjunctivae normal  Sclera: sclerae normal  EOM: EOM intact bilaterally  Neck  Neck: normal visual inspection and no lymphadenopathy  Thyroid : thyroid  normal  Carotids: no bruits  Resp  Effort & Inspection: normal respiratory effort  Auscultation: clear to auscultation bilaterally, no crackles, no rales, no rhonchi and  no wheezes  Cardio  Jugular venous pressure: no JVD  Rate: regular rate  Rhythm: regular rhythm  Heart Sounds: S1 normal, S2 normal, no click, no murmurs and no rubs  Bruits: no carotid bruits  GI  Inspection: Yes normal to inspection  Palpation: soft, no hepatosplenomegaly, no guarding, no masses and nontender  Auscultation: normal bowel sounds  Neuro  General: patient oriented x3  Extrem  General: normal to inspection, normal exam except as noted, clubbing, cyanosis or edema noted, normal gait and other  Psych  Mental Status: mental status grossly normal  Mood: congruent mood  Affect: normal affect  Insight: insight good  Judgment: judgment good  Hip good ROM   alignment off minimal           Transition of Care Contact Information  Discharge date: Discharge Date: Not Found  Transition Facility Type--Hospital (Inpatient or Observation)  Facility Name--[facility] Interactive Contact(s):  Clinical Staff Name/Role who contacted--becky     Data Reviewed  Medication Reconciliation completed    Assessment and Plan  ICD-10-CM    1. Hospital discharge follow-up  Z09       2. Right ureteral stone  N20.1       3. Acetabular labrum tear  S73.199A       4. Neuropathic pain  M79.2       5. 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

## 2024-08-03 DIAGNOSIS — S73199A Other sprain of unspecified hip, initial encounter: Secondary | ICD-10-CM | POA: Insufficient documentation

## 2024-08-03 DIAGNOSIS — M792 Neuralgia and neuritis, unspecified: Secondary | ICD-10-CM | POA: Insufficient documentation

## 2024-08-03 NOTE — Progress Notes (Incomplete)
 [facility]  [address]  [place] [address]  Operated by [facility]  Transitional Care Management Note    Name: JAKOBEE BRACKINS MRN:  [id number]   Date: 07/22/2024 Age: 38 y.o.     Chief Complaint: Hospital Follow Up [facility] follow up   kidney stone removed and hip surgery) and Hospital Discharge Transition       SUBJECTIVE:  Malcomb J Seefeld is a 38 y.o. male presenting today for follow-up after being discharged. The main problem requiring admission was in for  renal stone   than to next surgery  labrum tear and  top of femur  shaved.   Pt hospital complications   3 weeks post op and way ahead as he is off crutches as of yesterday     Pt is going to Therapy at  [employer] [facility]  2 days a week    Surgery was  1 hr        Pt said no high impact  running rugby etc    No other restrictions    Dr Velva    [facility]     Pt says this is the first time yrs since  he had  surgery  yrs ago  that He is pain free  in  socket     S/p Labrum tear surgery     Post renal stone  pt was on ab few  days      06/24/2024  CYSTOSCOPY WITH RIGHT RETROGRADE PYELOGRAM; RIGHT URETEROSCOPY WITH STONE MANIPULATION   Dr Sandee       I/O 's all good off Flomax        Both hospital records reviewed    OBJECTIVE:   BP 130/86 (Site: Left Arm, Patient Position: Sitting)   Pulse (!) 115   Ht 1.651 m (5' 5)   SpO2 94%   BMI 34.28 kg/m   Exam-AMB  Const  General: cooperative, healthy appearing and no acute distress  Orientation/Consciousness: patient oriented x3  HENMT  Ears: hearing grossly normal bilaterally  Eyes  General: appearance normal, both eyes and all related structures  Conjunctivae: conjunctivae normal  Sclera: sclerae normal  EOM: EOM intact bilaterally  Neck  Neck: normal visual inspection and no lymphadenopathy  Thyroid : thyroid  normal  Carotids: no bruits  Resp  Effort & Inspection: normal respiratory effort  Auscultation: clear to auscultation bilaterally, no crackles, no rales, no rhonchi and  no wheezes  Cardio  Jugular venous pressure: no JVD  Rate: regular rate  Rhythm: regular rhythm  Heart Sounds: S1 normal, S2 normal, no click, no murmurs and no rubs  Bruits: no carotid bruits  GI  Inspection: Yes normal to inspection  Palpation: soft, no hepatosplenomegaly, no guarding, no masses and nontender  Auscultation: normal bowel sounds  Neuro  General: patient oriented x3  Extrem  General: normal to inspection, normal exam except as noted, clubbing, cyanosis or edema noted, normal gait and other  Psych  Mental Status: mental status grossly normal  Mood: congruent mood  Affect: normal affect  Insight: insight good  Judgment: judgment good          Transition of Care Contact Information  Discharge date: Discharge Date: Not Found  Transition Facility Type--Hospital (Inpatient or Observation)  Facility Name--[facility] Interactive Contact(s):  Clinical Staff Name/Role who contacted--becky     Data Reviewed  Medication Reconciliation completed    Assessment and Plan    ICD-10-CM    1. Hospital discharge follow-up  Z09         Other transition actions (Optional) -: Discharge documentation was reviewed, Discharge documentation used to reconcile outpatient medication list, Pending tests or treatments were discussed with the patient-family-caregiver , and )Pt is in therapy  2 x per week        Pt is awaiting an additional surgery on hip /leg     Continue therapy     Last labs all stable   Keep fu with Urology    Keep Fu with Ortho   Pt is walking great and doing well     Suzen DELENA Asa, PA-C

## 2024-08-12 ENCOUNTER — Encounter (INDEPENDENT_AMBULATORY_CARE_PROVIDER_SITE_OTHER): Payer: Self-pay

## 2024-08-21 ENCOUNTER — Encounter (INDEPENDENT_AMBULATORY_CARE_PROVIDER_SITE_OTHER): Payer: Self-pay | Admitting: Internal Medicine

## 2024-08-21 ENCOUNTER — Other Ambulatory Visit: Payer: Self-pay

## 2024-08-21 ENCOUNTER — Ambulatory Visit: Payer: 59 | Attending: Internal Medicine | Admitting: Internal Medicine

## 2024-08-21 DIAGNOSIS — M25519 Pain in unspecified shoulder: Secondary | ICD-10-CM | POA: Insufficient documentation

## 2024-08-21 DIAGNOSIS — M25512 Pain in left shoulder: Secondary | ICD-10-CM

## 2024-08-21 MED ORDER — PREDNISONE 20 MG TABLET
20.0000 mg | ORAL_TABLET | Freq: Two times a day (BID) | ORAL | 0 refills | Status: AC
Start: 2024-08-21 — End: 2024-08-26
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
## 2024-08-25 ENCOUNTER — Encounter (INDEPENDENT_AMBULATORY_CARE_PROVIDER_SITE_OTHER): Payer: Self-pay | Admitting: Internal Medicine

## 2024-09-19 ENCOUNTER — Ambulatory Visit (INDEPENDENT_AMBULATORY_CARE_PROVIDER_SITE_OTHER): Payer: Self-pay | Admitting: Internal Medicine

## 2024-09-24 ENCOUNTER — Ambulatory Visit: Payer: Self-pay | Admitting: Internal Medicine

## 2024-10-09 ENCOUNTER — Encounter (INDEPENDENT_AMBULATORY_CARE_PROVIDER_SITE_OTHER): Payer: Self-pay | Admitting: Internal Medicine

## 2024-10-17 ENCOUNTER — Encounter (INDEPENDENT_AMBULATORY_CARE_PROVIDER_SITE_OTHER): Admitting: UROLOGY

## 2024-10-18 NOTE — Progress Notes (Signed)
 This note was generated with the assistance of [employer] with the patient or legal representative's consent.    EPIC MRN: [id number]     CARE TEAM:  Patient Care Team:  Pcp, No as PCP - General (General Practice)    ASSESSMENT:  1. Radiculopathy of cervical region    2. Acute pain of left shoulder       Patient Active Problem List   Diagnosis   . Epilepsy     Impression:   Assessment & Plan  1. Left shoulder pain:  The left shoulder pain is likely due to a pinched nerve in the cervical spine, as indicated by the absence of any abnormalities in the shoulder MRI. The neck appears straight, suggesting muscle tightness, but no fractures are visible. The vertebral heights and spaces are normal. Physical therapy could potentially aid in the resorption of a herniated disk, reducing the need for surgical intervention.     A Medrol  Dosepak, tizanidine, and Mobic  have been prescribed. Mobic  should be started after completing the steroid pack. Ice application to the affected area and daily stretches at home are recommended. If the condition deteriorates or therapy cannot be continued, an MRI can be easily arranged.    Follow-up: The patient will follow up in 6 weeks.         PLAN:  The nature of the patient's condition and treatment options were discussed as described below.    Medication Counseling  Acetaminophen : Acetaminophen  is a drug the is commonly used as a pain reliever. The maximum daily dose is 4 grams. The dosing for a child is based on the child's age and weight. Since acetaminophen  is metabolized by the liver, any drug that affects the liver can change the level of acetaminophen  in the body. The potential for acetaminophen  to damage the liver is increased if it is used with alcohol. Acetaminophen  may increase the blood thinning effect of Coumadin. Long term administration of acetaminophen  with Coumadin should be discussed with your PCP. Side effects from acetaminophen  are uncommon. The most serious effect is liver  damage is used in large doses. The patient verbalized understanding of the proper use and possible adverse side effects of acetaminophen . All the patient's questions and concerns were addressed.     Exercise Program Referral or Distribution  Physical Therapy: Active therapies include stretching exercises, weight training, and cardiovascular condition. Passive therapies include ice, heat, massage, ultrasound, and electrical stimulation. Your therapist will decide which of these interventions will be most beneficial to you.  Home Exercise Program: A list of active and/or passive exercises was dispensed to the patient today to perform independently. We discussed that failure to perform the exercise program may compromise the patient's outcome.     Surgical Options and Alternatives  Bracing and other ambulatory assistance: Use of a brace or assistive ambulatory device can aid to offload pressure from a joint and alleviate discomfort from that region. There is little to no risk associated with this treatment option.  Intraarticular steroid injection: I discussed with the patient that this involves the injection of a potent anti-inflammatory (cortisone) into the joint. Risks include whitening of the skin at the injection site and a transient rise in blood glucose. Complications are extremely rare; infection is the most common. There is no rule as to how many injections can be given. Because some research shows that too much cortisone can damage cartilage, most physicians limit how many injections they will give you.  Potential for future surgery: I explained  that though I am not recommending a surgical intervention at this time, this mat be recommended or necessary in the future to alleviate or treat this condition, especially if conservative measures fail or the condition continues to progress or worsen.     Diagnostic Studies and Medical Documentation Reviewed   I independently reviewed and discussed the results of the  X-ray images with the patient.    After discussing the risks, benefits, and alternatives, we decided on the following treatment plan:       Orders Placed This Encounter   . X-ray cervical spine complete 4 or 5 views (72050)   . Ambulatory referral to Physical Therapy   . BP Patient Education   . methylPREDNISolone  4 MG tablet therapy pack   . tiZANidine (ZANAFLEX) 4 MG tablet   . meloxicam  (MOBIC ) 15 MG tablet       Follow-up: Return in about 6 weeks (around 11/28/2024) for Follow Up with Dr. Vale.     HISTORY OF PRESENT ILLNESS:  Chief Complaint: Pain of the Left Shoulder (Patient presents today with complaints of left shoulder pain. He has been evaluated by Roddie, Dr. Anderson, and Dr. Ebb.  He states he continues to have pain.  He has been attending PT and has had increasing pain since his visit on Wednesday.) Age: 38 y.o.   Sex: male   Occupation: disabled  Hand-dominance: Left    History of present illness:  History of Present Illness  The patient is a 38 year old male who presents for evaluation of left shoulder pain.    He has previously sought medical attention for his left shoulder pain, initially undergoing physical therapy. Following a few sessions, an MRI was conducted, which revealed no abnormalities. However, he was diagnosed with a frozen shoulder. A second opinion suggested that the condition was not a frozen shoulder and recommended a return to physical therapy. Despite this, his symptoms have persisted. He describes the pain as a sensation of muscle weakness and bone discomfort. He also experiences occasional tingling in his fingers. He has not undergone any cervical spine imaging. He recently completed a course of prednisone  and has been taking ibuprofen  for pain management. He is currently unemployed due to disability.    SOCIAL HISTORY  Occupations: Disabled    FAMILY HISTORY  - Mother: Blood clot  - Father: Broken nose, whiplash, back sprain    Negative for other conditions or  diseases.    OBJECTIVE:  His Weight: 219 lb (99.3 kg) Height: 5' 6 body mass index is 35.35 kg/m.       Physical Exam  Musculoskeletal:  Left shoulder: Pain and weakness  Cervical spine: Tightness with reduced range of motion  Cervical spine:  Skin intact, no erythema or edema  TTP over the paraspinal muscles  TTP over the cervical spine  Painful flexion, extension, lateral bending, rotation  Positive compression testing  Positive lateral facet compression testing, to the right  Limited ROM of the shoulder  Full strength of the shoulder  Distal sensation intact    IMAGING / STUDIES:     Order: XR CERVICAL COMPLETE 4 OR 5 VW - Indication: Radiculopathy of   cervical region         X-ray cervical spine complete 4 or 5 views (27949)  Result Date: 10/18/2024  Shielding: Not Shielded. Standing. AP, Lateral, Flexion, Extension.     Impression: Independent review of x-rays taken in the OV clinic today. AP, lateral, flexion, and extension views of the cervical spine  shows loss of normal lordosis. No fracture or dislocation. Maintained vertebral heights and disc spaces. No lytic lesion, no soft tissue edema, normal bony mineralization.       PROCEDURES:  Procedures        Supervising physician: Kaitlin Mahoney

## 2024-10-29 ENCOUNTER — Ambulatory Visit: Payer: Self-pay | Admitting: Internal Medicine

## 2024-10-29 ENCOUNTER — Other Ambulatory Visit: Payer: Self-pay

## 2024-10-29 ENCOUNTER — Encounter (INDEPENDENT_AMBULATORY_CARE_PROVIDER_SITE_OTHER): Payer: Self-pay | Admitting: Internal Medicine

## 2024-10-29 VITALS — BP 138/82 | HR 104 | Temp 98.3°F | Ht 65.0 in

## 2024-10-29 DIAGNOSIS — I10 Essential (primary) hypertension: Secondary | ICD-10-CM

## 2024-10-29 DIAGNOSIS — E66811 Obesity, class 1: Secondary | ICD-10-CM

## 2024-10-29 DIAGNOSIS — E785 Hyperlipidemia, unspecified: Secondary | ICD-10-CM

## 2024-10-29 DIAGNOSIS — Z6833 Body mass index (BMI) 33.0-33.9, adult: Secondary | ICD-10-CM

## 2024-10-29 NOTE — Nursing Note (Signed)
Follow up no problems today
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
## 2024-10-30 ENCOUNTER — Ambulatory Visit (INDEPENDENT_AMBULATORY_CARE_PROVIDER_SITE_OTHER): Payer: Self-pay | Admitting: Internal Medicine

## 2024-10-30 DIAGNOSIS — E66811 Obesity, class 1: Secondary | ICD-10-CM

## 2024-10-30 DIAGNOSIS — I10 Essential (primary) hypertension: Secondary | ICD-10-CM

## 2024-10-30 DIAGNOSIS — E785 Hyperlipidemia, unspecified: Secondary | ICD-10-CM

## 2024-10-30 LAB — SCAN DIFFERENTIAL
PLATELET MORPHOLOGY COMMENT: NORMAL
RBC MORPHOLOGY COMMENT: NORMAL
SCHISTOCYTES: ABSENT

## 2024-11-03 ENCOUNTER — Telehealth (INDEPENDENT_AMBULATORY_CARE_PROVIDER_SITE_OTHER): Payer: Self-pay | Admitting: Internal Medicine

## 2024-11-03 MED ORDER — ATORVASTATIN 80 MG TABLET: 80.0000 mg | ORAL_TABLET | Freq: Every evening | ORAL | 3 refills | Status: AC

## 2024-11-03 NOTE — Telephone Encounter (Signed)
 Pe rkim may need to go to ed he needs to be scan   patient to go

## 2024-11-05 ENCOUNTER — Emergency Department (HOSPITAL_BASED_OUTPATIENT_CLINIC_OR_DEPARTMENT_OTHER)

## 2024-11-05 ENCOUNTER — Encounter (HOSPITAL_BASED_OUTPATIENT_CLINIC_OR_DEPARTMENT_OTHER): Payer: Self-pay

## 2024-11-05 ENCOUNTER — Other Ambulatory Visit: Payer: Self-pay

## 2024-11-05 ENCOUNTER — Emergency Department
Admission: EM | Admit: 2024-11-05 | Discharge: 2024-11-05 | Disposition: A | Discharge status: Home / Self Care | Attending: Emergency Medicine | Admitting: Emergency Medicine

## 2024-11-05 DIAGNOSIS — R109 Unspecified abdominal pain: Secondary | ICD-10-CM

## 2024-11-05 DIAGNOSIS — R1032 Left lower quadrant pain: Secondary | ICD-10-CM | POA: Insufficient documentation

## 2024-11-05 DIAGNOSIS — N200 Calculus of kidney: Secondary | ICD-10-CM | POA: Insufficient documentation

## 2024-11-05 DIAGNOSIS — K900 Celiac disease: Secondary | ICD-10-CM

## 2024-11-05 DIAGNOSIS — Z9049 Acquired absence of other specified parts of digestive tract: Secondary | ICD-10-CM | POA: Insufficient documentation

## 2024-11-05 DIAGNOSIS — K5900 Constipation, unspecified: Secondary | ICD-10-CM | POA: Insufficient documentation

## 2024-11-05 DIAGNOSIS — D72829 Elevated white blood cell count, unspecified: Secondary | ICD-10-CM

## 2024-11-05 LAB — CBC WITH DIFF
BASOPHIL #: 0.04 x10ˆ3/uL (ref 0.00–0.10)
BASOPHIL %: 0 % (ref 0–1)
EOSINOPHIL #: 0.44 x10ˆ3/uL (ref 0.00–0.60)
EOSINOPHIL %: 3 % (ref 1–8)
HCT: 51.3 % — ABNORMAL HIGH (ref 36.7–47.1)
HGB: 17.8 g/dL — ABNORMAL HIGH (ref 12.5–16.3)
LYMPHOCYTE #: 2.39 x10ˆ3/uL (ref 1.00–3.00)
LYMPHOCYTE %: 18 % (ref 15–43)
MCH: 31.8 pg (ref 23.8–33.4)
MCHC: 34.8 g/dL (ref 32.5–36.3)
MCV: 91.4 fL (ref 73.0–96.2)
MONOCYTE #: 0.93 x10ˆ3/uL (ref 0.30–1.00)
MONOCYTE %: 7 % (ref 6–14)
MPV: 7.5 fL (ref 7.4–11.4)
NEUTROPHIL #: 9.42 x10ˆ3/uL — ABNORMAL HIGH (ref 1.85–7.84)
NEUTROPHIL %: 71 % (ref 44–74)
PLATELETS: 331 x10ˆ3/uL (ref 140–440)
RBC: 5.61 x10ˆ6/uL (ref 4.06–5.63)
RDW: 15 % (ref 12.1–16.2)
WBC: 13.2 x10ˆ3/uL — ABNORMAL HIGH (ref 3.6–10.2)

## 2024-11-05 LAB — COMPREHENSIVE METABOLIC PANEL, NON-FASTING
ALBUMIN/GLOBULIN RATIO: 1.4 (ref 0.8–1.4)
ALBUMIN: 4.3 g/dL (ref 3.4–5.0)
ALKALINE PHOSPHATASE: 84 U/L (ref 46–116)
ALT (SGPT): 50 U/L (ref <=78)
ANION GAP: 12 mmol/L (ref 4–13)
AST (SGOT): 14 U/L — ABNORMAL LOW (ref 15–37)
BILIRUBIN TOTAL: 0.2 mg/dL (ref 0.2–1.0)
BUN/CREA RATIO: 15
BUN: 11 mg/dL (ref 7–18)
CALCIUM, CORRECTED: 9 mg/dL
CALCIUM: 9.2 mg/dL (ref 8.5–10.1)
CHLORIDE: 105 mmol/L (ref 98–107)
CO2 TOTAL: 22 mmol/L (ref 21–32)
CREATININE: 0.74 mg/dL (ref 0.70–1.30)
ESTIMATED GFR: 120 mL/min/1.73mˆ2 (ref >59)
GLOBULIN: 3
GLUCOSE: 128 mg/dL — ABNORMAL HIGH (ref 74–106)
OSMOLALITY, CALCULATED: 279 mosm/kg (ref 270–290)
POTASSIUM: 3.7 mmol/L (ref 3.5–5.1)
PROTEIN TOTAL: 7.3 g/dL (ref 6.4–8.2)
SODIUM: 139 mmol/L (ref 136–145)

## 2024-11-05 LAB — URINALYSIS, MACRO/MICRO
BILIRUBIN: NEGATIVE mg/dL
GLUCOSE: NEGATIVE mg/dL
KETONES: NEGATIVE mg/dL
LEUKOCYTES: NEGATIVE WBCs/uL
NITRITE: NEGATIVE
PH: 6 (ref 4.6–8.0)
PROTEIN: NEGATIVE mg/dL
SPECIFIC GRAVITY: 1.02 (ref 1.003–1.035)
UROBILINOGEN: 0.2 mg/dL (ref 0.2–1.0)

## 2024-11-05 LAB — URINALYSIS, MICROSCOPIC

## 2024-11-05 MED ORDER — SODIUM CHLORIDE 0.9 % IV BOLUS
1000.0000 mL | INJECTION | Status: AC
  Administered 2024-11-05: 1000 mL via INTRAVENOUS
  Administered 2024-11-05: 0 mL via INTRAVENOUS

## 2024-11-05 MED ORDER — SODIUM CHLORIDE 0.9 % (FLUSH) INJECTION SYRINGE: 3.0000 mL | INJECTION | INTRAMUSCULAR | Status: DC | PRN

## 2024-11-05 MED ORDER — DEXTROSE 5% IN WATER (D5W) FLUSH BAG - 250 ML: INTRAVENOUS | Status: DC | PRN

## 2024-11-05 MED ORDER — IOPAMIDOL 370 MG IODINE/ML (76 %) INTRAVENOUS SOLUTION
75.0000 mL | INTRAVENOUS | Status: AC
  Administered 2024-11-05: 75 mL via INTRAVENOUS
  Filled 2024-11-05: qty 100

## 2024-11-05 MED ORDER — SODIUM CHLORIDE 0.9 % (FLUSH) INJECTION SYRINGE
3.0000 mL | INJECTION | Freq: Three times a day (TID) | INTRAMUSCULAR | Status: DC
  Administered 2024-11-05: 0 mL

## 2024-11-05 MED ORDER — SODIUM CHLORIDE 0.9% FLUSH BAG - 250 ML: INTRAVENOUS | Status: DC | PRN

## 2024-11-05 NOTE — Discharge Instructions (Signed)
 Continue MiraLax daily for the next couple of days.  Increase your fluid intake.  Follow up closely with your PCP and return if any worsening of symptoms.  Increase her fiber in her diet.

## 2024-11-05 NOTE — ED Provider Notes (Signed)
  Medicine [facility]  ED Primary Provider Note  Patient Name: Andrew Collier  Patient Age: 38 y.o.  Date of Birth: March 15, 1986    Chief Complaint: Abdominal Pain (Pt states started this morning LLQ pain and bloated.)        History of Present Illness       Andrew Collier is a 38 y.o. male who had concerns including Abdominal Pain.  Patient has had 4 days of left lower abdominal pain that had gotten worse this morning.  Patient has known history of celiac disease but states he has felt bloated.  He has had some mild nausea but no vomiting.  He denies any diarrhea or constipation.  He did take some MiraLax and reports his last bowel movement was 2 hours ago and was normal without any blood or noted dark stools.  He denies any fevers, sweats, chills.  He has got no urinary frequency, urgency, dysuria or hematuria.  He denies any lower back pain.  Surgical history includes appendectomy and cholecystectomy.  He states the pressure he describes as 7/10 and tolerable but annoying.  Patient has had a colonoscopy within last couple of years when he was diagnosed with celiac disease        Review of Systems     No other overt Review of Systems are noted to be positive except noted in the HPI.      Historical Data   History Reviewed This Encounter: Medical History  Surgical History  Family History  Social History      Physical Exam   ED Triage Vitals [11/05/24 1403]   BP (Non-Invasive) (!) 149/109   Heart Rate (!) 109   Respiratory Rate 18   Temperature 36.5 C (97.7 F)   SpO2 94 %   Weight 98.4 kg (216 lb 14.4 oz)   Height 1.7 m (5' 6.93)         Nursing notes reviewed for what could be assessed. Past Medical, Surgical, and Social history reviewed for what has been completed.     Constitutional: NAD. Well-Developed. Well Nourished.  Alert and oriented, hydrated, afebrile  Head: Normocephalic, atraumatic.  Eyes: EOM grossly intact, conjunctiva normal.  Neck: Supple, Full range of  motion  Cardiovascular:  Tachycardic Rate and Rhythm, extremities well perfused.  Pulmonary/Chest: No respiratory distress. Lungs are symmetric to auscultation bilaterally.  Abdominal: Soft, mild tenderness to palpation left lower quadrant, non-distended. Non peritoneal, no rebound, no guarding.  Bowel sounds hyperactive  MSK: No Lower Extremity Edema.  Skin: Warm, dry, and intact.  Neuro: Appropriate, CN II-XII grossly intact. Gait steady  Psych: Pleasant           Procedures none      Patient Data     Labs Ordered/Reviewed   COMPREHENSIVE METABOLIC PANEL, NON-FASTING - Abnormal; Notable for the following components:       Result Value    GLUCOSE 128 (*)     AST (SGOT) 14 (*)     All other components within normal limits    Narrative:     Estimated Glomerular Filtration Rate (eGFR) is calculated using the CKD-EPI (2021) equation, intended for patients 83 years of age and older. If gender is not documented or unknown, there will be no eGFR calculation.   CBC WITH DIFF - Abnormal; Notable for the following components:    WBC 13.2 (*)     HGB 17.8 (*)     HCT 51.3 (*)     NEUTROPHIL #  9.42 (*)     All other components within normal limits   URINALYSIS, MACRO/MICRO - Abnormal; Notable for the following components:    APPEARANCE Slightly Hazy (*)     BLOOD Trace (*)     All other components within normal limits   URINALYSIS, MICROSCOPIC - Abnormal; Notable for the following components:    BACTERIA Few (*)     MUCOUS Moderate (*)     All other components within normal limits   CBC/DIFF    Narrative:     The following orders were created for panel order CBC/DIFF.  Procedure                               Abnormality         Status                     ---------                               -----------         ------                     CBC WITH IPQQ[[id number]]                Abnormal            Final result                 Please view results for these tests on the individual orders.   URINALYSIS WITH REFLEX MICROSCOPIC  AND CULTURE IF POSITIVE    Narrative:     The following orders were created for panel order URINALYSIS WITH REFLEX MICROSCOPIC AND CULTURE IF POSITIVE.  Procedure                               Abnormality         Status                     ---------                               -----------         ------                     URINALYSIS, MACRO/MICRO[[id number]]      Abnormal            Final result               URINALYSIS, MICROSCOPIC[[id number]]      Abnormal            Final result                 Please view results for these tests on the individual orders.       CT ABDOMEN PELVIS W IV CONTRAST   Final Result by Edi, Radresults In (12/10 1617)   1. NO ACUTE FINDINGS   2. BILATERAL NONOBSTRUCTING KIDNEY STONES.            Radiologist location ID: [id number]             Medical Decision Making          Medical Decision Making  Amount and/or Complexity of Data Reviewed  Labs: ordered.  Radiology: ordered.    Risk  Prescription drug management.            MDM Narrative:  Patient comes in with complaints of left lower abdominal pain that started 4 days ago and got worse morning.  Patient has known history of celiac disease had a colonoscopy within last 2 years.  He describes it as a constant pressure in the left lower quadrant last bowel movement was 2 hours ago.  Lab work has been ordered as well as CT scan with contrast for further evaluation.  While continuing to monitor IV fluids have been ordered.  I have offered him pain medication but currently declines.  16:21 patient was noted to have a mild leukocytosis of 13,000 but otherwise his lab work is unremarkable.  CT scan showed no acute abnormalities but there was some noted stool within the colon.  Plan will be to discharge patient home with close follow up with his PCP at the end of the week.  He is to use MiraLax daily and increase fiber in his diet and hydration.  He is encouraged to return if any worsening of symptoms, increasing pain, inability to keep  anything down.  There is no noted bowel obstruction or diverticulitis noted on the CT scan.  After an opportunity for questions answered.  Patient discharged home stable with family        Differential includes but not limited to:  Constipation, urinary tract infection, diverticulitis, bowel obstruction                                Medications Ordered/Administered in the ED   NS flush syringe (0 mL Intracatheter Not Given 11/05/24 1530)   NS flush syringe (has no administration in time range)   NS 250 mL flush bag (has no administration in time range)     And   D5W 250 mL flush bag (has no administration in time range)   NS bolus infusion 1,000 mL (0 mL Intravenous Stopped 11/05/24 1547)   iopamidol  (ISOVUE -370) 76% solution (75 mL Intravenous Given 11/05/24 1608)       Following the history, physical exam, and ED workup, the patient was deemed stable and suitable for discharge. The patient/caregiver was advised to return to the ED for any new or worsening symptoms. Discharge medications, and follow-up instructions were discussed with the patient/caregiver in detail, who verbalizes understanding. The patient/caregiver is in agreement and is comfortable with the plan of care.    Disposition: Discharged         Current Discharge Medication List        CONTINUE these medications - NO CHANGES were made during your visit.        Details   aspirin 81 mg Tablet, Delayed Release (E.C.)  Commonly known as: ECOTRIN   81 mg, 2 TIMES DAILY  Refills: 0     atorvastatin  80 mg Tablet  Commonly known as: LIPITOR   80 mg, Oral, EVERY EVENING  Qty: 30 Tablet  Refills: 3     famotidine  40 mg Tablet  Commonly known as: PEPCID    40 mg, Oral, 2 TIMES DAILY  Qty: 180 Tablet  Refills: 3     gabapentin  600 mg Tablet  Commonly known as: NEURONTIN    600 mg, Oral, 3 TIMES DAILY  Qty: 90 Tablet  Refills: 2     ketorolac  tromethamine   10 mg Tablet  Commonly known as: TORADOL    10 mg, Oral, EVERY 6 HOURS PRN  Qty: 20 Tablet  Refills: 0      lamoTRIgine  200 mg Tablet  Commonly known as: LaMICtal    200 mg, Oral, 2 TIMES DAILY  Qty: 180 Tablet  Refills: 2     Levocetirizine 5 mg Tablet  Commonly known as: XYZAL   5 mg, Oral, EVERY EVENING  Qty: 90 Tablet  Refills: 3     meloxicam  15 mg Tablet  Commonly known as: MOBIC    15 mg, Oral, Daily  Qty: 90 Tablet  Refills: 0     metoprolol  succinate 25 mg Tablet Sustained Release 24 hr  Commonly known as: TOPROL -XL   25 mg, Oral, Daily  Qty: 90 Tablet  Refills: 2     montelukast  10 mg Tablet  Commonly known as: SINGULAIR    10 mg, Oral, EVERY EVENING  Qty: 90 Tablet  Refills: 3     naproxen 500 mg Tablet  Commonly known as: NAPROSYN   500 mg, 2 TIMES DAILY WITH FOOD  Refills: 0     omeprazole  40 mg Capsule, Delayed Release(E.C.)  Commonly known as: PRILOSEC   40 mg, Oral, Daily  Qty: 90 Capsule  Refills: 3     ondansetron  4 mg Tablet, Rapid Dissolve  Commonly known as: ZOFRAN  ODT   4 mg, Oral, EVERY 8 HOURS PRN  Qty: 12 Tablet  Refills: 0     oxyCODONE 5 mg Tablet  Commonly known as: ROXICODONE   5 mg  Refills: 0     tamsulosin  0.4 mg Capsule  Commonly known as: FLOMAX    0.4 mg, Oral, EVERY EVENING AFTER DINNER  Qty: 30 Capsule  Refills: 0     telmisartan  40 mg Tablet  Commonly known as: Micardis    40 mg, Oral, Daily  Qty: 30 Tablet  Refills: 2     Viberzi  100 mg Tablet  Generic drug: eluxadoline    100 mg, Oral, 2 TIMES DAILY  Qty: 180 Tablet  Refills: 3            Follow up:   Paticia Suzen LABOR, PA-C  [address]  [address]  [place] [address]  [phone]    In 5 days  As needed               Based upon the clinical setting, the likely diagnosis/impression include:    Clinical Impression   Abdominal pain, unspecified abdominal location (Primary)   Constipation, unspecified constipation type         Current Discharge Medication List

## 2024-11-05 NOTE — ED Nurses Note (Signed)
 Patient discharged home. Reviewed instructions and prescriptions with patient. Questions sufficiently answered as needed. Patient verbalized understanding of instructions and prescriptions. Patient encouraged to follow up with PCP as indicated. In the event of an emergency, patient instructed to call 911 or go to the nearest emergency room. Patient ambulated off the unit.

## 2024-11-06 ENCOUNTER — Telehealth (HOSPITAL_COMMUNITY): Payer: Self-pay | Admitting: Internal Medicine

## 2024-11-06 NOTE — Telephone Encounter (Signed)
 Post Ed Follow-Up    Post ED Follow-Up:   Document completed and/or attempted interactive contact(s) after transition to home after emergency department stay.:   Transition Facility and relevant Date:   Discharge Date: 11/05/24  Discharge from [facility] Emergency Department?: Yes  Discharge Facility: [facility] (Comment: [facility])  Contacted by: Lorane Muzzy, RN  Contact method: Patient/Caregiver Telephone  Contact completed: 11/06/2024  3:25 PM  MyChart message sent?: No  Was the AVS reviewed with patient?: No  Patient was not available. Gave NN information to family member.

## 2024-11-13 ENCOUNTER — Other Ambulatory Visit (INDEPENDENT_AMBULATORY_CARE_PROVIDER_SITE_OTHER): Payer: Self-pay | Admitting: Internal Medicine

## 2024-11-13 MED ORDER — FLUTICASONE PROPIONATE 50 MCG/ACTUATION NASAL SPRAY,SUSPENSION: 1.0000 | Freq: Every day | NASAL | 3 refills | Status: AC

## 2024-12-20 ENCOUNTER — Emergency Department
Admission: EM | Admit: 2024-12-20 | Discharge: 2024-12-20 | Disposition: A | Discharge status: Home / Self Care | Attending: Emergency Medicine | Admitting: Emergency Medicine

## 2024-12-20 ENCOUNTER — Other Ambulatory Visit: Payer: Self-pay

## 2024-12-20 ENCOUNTER — Encounter (HOSPITAL_BASED_OUTPATIENT_CLINIC_OR_DEPARTMENT_OTHER): Payer: Self-pay

## 2024-12-20 ENCOUNTER — Emergency Department (HOSPITAL_BASED_OUTPATIENT_CLINIC_OR_DEPARTMENT_OTHER)

## 2024-12-20 DIAGNOSIS — W19XXXA Unspecified fall, initial encounter: Secondary | ICD-10-CM

## 2024-12-20 DIAGNOSIS — M79645 Pain in left finger(s): Secondary | ICD-10-CM

## 2024-12-20 DIAGNOSIS — W010XXA Fall on same level from slipping, tripping and stumbling without subsequent striking against object, initial encounter: Secondary | ICD-10-CM

## 2024-12-20 DIAGNOSIS — M79671 Pain in right foot: Secondary | ICD-10-CM | POA: Insufficient documentation

## 2024-12-20 DIAGNOSIS — R252 Cramp and spasm: Secondary | ICD-10-CM | POA: Insufficient documentation

## 2024-12-20 DIAGNOSIS — M25642 Stiffness of left hand, not elsewhere classified: Secondary | ICD-10-CM

## 2024-12-20 HISTORY — DX: Essential (primary) hypertension: I10

## 2024-12-20 NOTE — ED Provider Notes (Signed)
 [facility], [facility] - Emergency Department  ED Primary Provider Note  HPI:  Andrew Collier is a 39 y.o. male     Patient complains of right foot pain and tension left hand after slip and fall this evening.  Patient fell from standing height did not strike head did not lose consciousness.  States left fingers 3 and 4 have locked pain.  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  Patient also has pain in the right foot from the fall but has been able to walk gingerly since.  Denies any other injury.  Patient does have a history of generalized anxiety disorder epilepsy and osteoarthritis.  Taken no medications for her symptoms.  Not on on blood thinners.  Full code.    ROS review and negative aside from stated in HPI.    Physical Exam:  ED Triage Vitals [12/20/24 1917]   BP (Non-Invasive) (!) 167/128   Heart Rate (!) 117   Respiratory Rate 18   Temperature 36.8 C (98.2 F)   SpO2 95 %   Weight 96.6 kg (213 lb)   Height 1.651 m (5' 5)     No acute distress.  Patient awake alert oriented x3.  Mood is appropriate.  Pupils 3 mm equal round reactive.  Extraocular movements are intact.  Oropharynx is clear.  Mucous membranes moist.  Trachea midline.  Neck is supple.  Heart has regular rate and rhythm without significant murmurs rubs or gallops.  Lungs are clear to auscultation.  Abdomen soft nontender, nondistended.  Patient has fingers 3 and 4 of the left hand flexed at the D IP he is able to extend against resistance with little pain.  He has forearm soft in terms of compartments.  Cap refill brisk all digits.  No swelling no obvious deformity.  Patient has full range of motion of the right hip right knee and right ankle he has tenderness over the navicular bone no tenderness in the posterior aspect of the medial lateral malleoli or base of the 5th metatarsal.  Rotation of the midfoot does not elicit pain that has compression of the fibular head.  No rash no edema.      Patient data:  Labs  Ordered/Reviewed - No data to display  XR FOOT RIGHT   Final Result by Edi, Radresults In (01/24 2005)      No acute fracture of the right foot.                  Radiologist location ID: [id number]             MDM:  Right foot pain and left hand tension following a slip and fall injury.  Patient denies any other injury.  Did not strike head did not lose consciousness.  He is neurovascularly intact.  Maybe suffering from mild cramps of the left fingers.  That has no tendon involvement compartments are soft.  X-rays of the right foot showed no acute abnormality.  I did review these findings with the patient and answered his questions.  Referred patient to primary care.  He is ambulatory and comfortable with treatment plan.      Discharged  Clinical Impression   Right foot pain (Primary)   Muscle cramps   Accident due to mechanical fall without injury           Current Discharge Medication List        CONTINUE these medications - NO CHANGES were made during your visit.  Details   aspirin 81 mg Tablet, Delayed Release (E.C.)  Commonly known as: ECOTRIN   81 mg, 2 TIMES DAILY  Refills: 0     atorvastatin  80 mg Tablet  Commonly known as: LIPITOR   80 mg, Oral, EVERY EVENING  Qty: 30 Tablet  Refills: 3     famotidine  40 mg Tablet  Commonly known as: PEPCID    40 mg, Oral, 2 TIMES DAILY  Qty: 180 Tablet  Refills: 3     gabapentin  600 mg Tablet  Commonly known as: NEURONTIN    600 mg, Oral, 3 TIMES DAILY  Qty: 90 Tablet  Refills: 2     ketorolac  tromethamine  10 mg Tablet  Commonly known as: TORADOL    10 mg, Oral, EVERY 6 HOURS PRN  Qty: 20 Tablet  Refills: 0     lamoTRIgine  200 mg Tablet  Commonly known as: LaMICtal    200 mg, Oral, 2 TIMES DAILY  Qty: 180 Tablet  Refills: 2     Levocetirizine 5 mg Tablet  Commonly known as: XYZAL   5 mg, Oral, EVERY EVENING  Qty: 90 Tablet  Refills: 3     meloxicam  15 mg Tablet  Commonly known as: MOBIC    15 mg, Oral, Daily  Qty: 90 Tablet  Refills: 0     metoprolol  succinate 25 mg  Tablet Sustained Release 24 hr  Commonly known as: TOPROL -XL   25 mg, Oral, Daily  Qty: 90 Tablet  Refills: 2     montelukast  10 mg Tablet  Commonly known as: SINGULAIR    10 mg, Oral, EVERY EVENING  Qty: 90 Tablet  Refills: 3     naproxen 500 mg Tablet  Commonly known as: NAPROSYN   500 mg, 2 TIMES DAILY WITH FOOD  Refills: 0     omeprazole  40 mg Capsule, Delayed Release(E.C.)  Commonly known as: PRILOSEC   40 mg, Oral, Daily  Qty: 90 Capsule  Refills: 3     ondansetron  4 mg Tablet, Rapid Dissolve  Commonly known as: ZOFRAN  ODT   4 mg, Oral, EVERY 8 HOURS PRN  Qty: 12 Tablet  Refills: 0     oxyCODONE 5 mg Tablet  Commonly known as: ROXICODONE   5 mg  Refills: 0     tamsulosin  0.4 mg Capsule  Commonly known as: FLOMAX    0.4 mg, Oral, EVERY EVENING AFTER DINNER  Qty: 30 Capsule  Refills: 0     telmisartan  40 mg Tablet  Commonly known as: Micardis    40 mg, Oral, Daily  Qty: 30 Tablet  Refills: 2     Viberzi  100 mg Tablet  Generic drug: eluxadoline    100 mg, Oral, 2 TIMES DAILY  Qty: 180 Tablet  Refills: 3            ASK your doctor about these medications.        Details   fluticasone  propionate 50 mcg/actuation Spray, Suspension  Commonly known as: FLONASE   Ask about: Should I take this medication?   1 Spray, Each Nostril, Daily  Qty: 16 g  Refills: 3

## 2024-12-20 NOTE — Discharge Instructions (Addendum)
 Your evaluation today did not reveal critical condition requiring immediate hospitalization.  Alternate between Tylenol  and ibuprofen  every 4-6 hours for pain relief.  Follow up closely with primary care ideally days.  Return to emergency department sooner worsening swelling or any other concerning symptoms.  Thank you for visiting [facility]

## 2024-12-20 NOTE — ED Nurses Note (Signed)
 Patient complaining of a fall from standing, pain to right foot, and reports left hand is locked up patient is alert and oriented x4, able to answer all questions appropriately on assessment. Patient is hypertensive at time of triage and on assessment, patient states that he does have a history of hypertension and forgot to take blood pressure medication this morning. Patient states that he slipped and fell on ice. Patient denies hitting head or LOC. Patient states that right foot went under him and he fell on it, now complaining of pain to right foot. ROM is intact in right foot, patient reports pain with movement. Patient also reports that after fall, left hand is locked up. On assessment left middle and ring fingers are drawn to patient palm. Patient is unable to straighten fingers on assessment. No further concerns or complaints noted at this time.

## 2024-12-20 NOTE — ED Nurses Note (Signed)
 Patient discharged home with family.  AVS reviewed with patient/care giver.  A written copy of the AVS and discharge instructions was given to the patient/care giver. Scripts handed to patient/care giver. Questions sufficiently answered as needed.  Patient/care giver encouraged to follow up with PCP as indicated.  In the event of an emergency, patient/care giver instructed to call 911 or go to the nearest emergency room.

## 2024-12-21 ENCOUNTER — Telehealth (HOSPITAL_COMMUNITY): Payer: Self-pay | Admitting: Internal Medicine

## 2024-12-21 NOTE — Progress Notes (Signed)
 Post Ed Follow-Up    Post ED Follow-Up:   Document completed and/or attempted interactive contact(s) after transition to home after emergency department stay.:   Transition Facility and relevant Date:   Discharge Date: 12/20/24  Discharge from [facility] Emergency Department?: Yes  Discharge Facility: [facility]  Contacted by: Reena Cleverly RN  Contact method: Patient/Caregiver Telephone, MyChart Patient Portal  Contact first attempt: 12/21/2024  2:58 PM  Contact second attempt: 12/21/2024  2:58 PM  MyChart message sent?: Yes  Was the AVS reviewed with patient?: No  Medications prescribed: No  Follow Up Visit: No answer - left message  HIPAA compliant message left with Nurse Navigator call back contact number.   Patient to contact clinic to schedule follow up.    Reena Cleverly RN  Nurse Navigator Population Health

## 2024-12-23 ENCOUNTER — Telehealth (INDEPENDENT_AMBULATORY_CARE_PROVIDER_SITE_OTHER): Payer: Self-pay | Admitting: Internal Medicine

## 2024-12-23 NOTE — Telephone Encounter (Signed)
 Post Ed Follow-Up    Post ED Follow-Up:   Document completed and/or attempted interactive contact(s) after transition to home after emergency department stay.:   Transition Facility and relevant Date:   Discharge Date: 12/20/24  Discharge from [facility] Emergency Department?: Yes  Discharge Facility: [facility]  Contacted by: rhoda pt went to bldf er  Contact method: Patient/Caregiver Telephone  Contact completed: 12/23/2024  4:34 PM  Contact first attempt: 12/23/2024  4:34 PM  MyChart message sent?: No  Was the AVS reviewed with patient?: No  Did the patient attempt to reach their PCP prior to going to the ED?: No  How is the patient recovering?: Improving  Medications prescribed: No  Were they obtained?: No  Interventions: No needs identified  Follow Up Visit: Patient declined follow up appt  Pt does not need follow up at this time

## 2025-03-02 ENCOUNTER — Ambulatory Visit (INDEPENDENT_AMBULATORY_CARE_PROVIDER_SITE_OTHER): Payer: Self-pay | Admitting: Internal Medicine
